# Patient Record
Sex: Male | Born: 1970 | Race: White | Hispanic: No | Marital: Married | Smoking: Never smoker
Health system: Southern US, Community
[De-identification: ages and names within clinical notes are randomized; demographics above are authoritative.]

## PROBLEM LIST (undated history)

## (undated) DIAGNOSIS — C801 Malignant (primary) neoplasm, unspecified: Secondary | ICD-10-CM

## (undated) DIAGNOSIS — C8190 Hodgkin lymphoma, unspecified, unspecified site: Secondary | ICD-10-CM

## (undated) HISTORY — PX: LYMPH NODE DISSECTION: SHX5087

## (undated) HISTORY — DX: Hodgkin lymphoma, unspecified, unspecified site: C81.90

---

## 2008-11-10 ENCOUNTER — Emergency Department (HOSPITAL_COMMUNITY): Admission: EM | Admit: 2008-11-10 | Discharge: 2008-11-11 | Payer: Self-pay | Admitting: Emergency Medicine

## 2010-04-10 ENCOUNTER — Other Ambulatory Visit (INDEPENDENT_AMBULATORY_CARE_PROVIDER_SITE_OTHER): Payer: Self-pay | Admitting: Otolaryngology

## 2010-04-10 DIAGNOSIS — R221 Localized swelling, mass and lump, neck: Secondary | ICD-10-CM

## 2010-04-17 ENCOUNTER — Other Ambulatory Visit: Payer: Self-pay

## 2010-06-29 ENCOUNTER — Encounter: Payer: Self-pay | Admitting: Oncology

## 2010-07-01 ENCOUNTER — Other Ambulatory Visit: Payer: Self-pay | Admitting: Oncology

## 2010-07-01 ENCOUNTER — Encounter (HOSPITAL_BASED_OUTPATIENT_CLINIC_OR_DEPARTMENT_OTHER): Payer: PRIVATE HEALTH INSURANCE | Admitting: Oncology

## 2010-07-01 DIAGNOSIS — C8190 Hodgkin lymphoma, unspecified, unspecified site: Secondary | ICD-10-CM

## 2010-07-01 DIAGNOSIS — C8111 Nodular sclerosis classical Hodgkin lymphoma, lymph nodes of head, face, and neck: Secondary | ICD-10-CM

## 2010-07-01 LAB — CBC WITH DIFFERENTIAL/PLATELET
Basophils Absolute: 0 10*3/uL (ref 0.0–0.1)
EOS%: 1.3 % (ref 0.0–7.0)
HCT: 43.7 % (ref 38.4–49.9)
MCH: 30 pg (ref 27.2–33.4)
MCHC: 34.3 g/dL (ref 32.0–36.0)
MONO#: 0.4 10*3/uL (ref 0.1–0.9)
NEUT%: 72.2 % (ref 39.0–75.0)
Platelets: 297 10*3/uL (ref 140–400)
RDW: 12.2 % (ref 11.0–14.6)
WBC: 7.9 10*3/uL (ref 4.0–10.3)
lymph#: 1.6 10*3/uL (ref 0.9–3.3)

## 2010-07-01 LAB — COMPREHENSIVE METABOLIC PANEL
AST: 19 U/L (ref 0–37)
Alkaline Phosphatase: 66 U/L (ref 39–117)
BUN: 15 mg/dL (ref 6–23)
Calcium: 10.4 mg/dL (ref 8.4–10.5)
Chloride: 103 mEq/L (ref 96–112)
Glucose, Bld: 93 mg/dL (ref 70–99)
Sodium: 140 mEq/L (ref 135–145)
Total Bilirubin: 0.5 mg/dL (ref 0.3–1.2)
Total Protein: 7.7 g/dL (ref 6.0–8.3)

## 2010-07-01 LAB — URIC ACID: Uric Acid, Serum: 4.8 mg/dL (ref 4.0–7.8)

## 2010-07-02 ENCOUNTER — Other Ambulatory Visit: Payer: Self-pay | Admitting: Oncology

## 2010-07-02 DIAGNOSIS — C8190 Hodgkin lymphoma, unspecified, unspecified site: Secondary | ICD-10-CM

## 2010-07-08 ENCOUNTER — Ambulatory Visit (HOSPITAL_COMMUNITY)
Admission: RE | Admit: 2010-07-08 | Discharge: 2010-07-08 | Disposition: A | Discharge status: Home / Self Care | Payer: PRIVATE HEALTH INSURANCE | Source: Ambulatory Visit | Attending: Oncology | Admitting: Oncology

## 2010-07-08 ENCOUNTER — Encounter (HOSPITAL_COMMUNITY)
Admission: RE | Admit: 2010-07-08 | Discharge: 2010-07-08 | Disposition: A | Discharge status: Home / Self Care | Payer: PRIVATE HEALTH INSURANCE | Source: Ambulatory Visit | Attending: Oncology | Admitting: Oncology

## 2010-07-08 DIAGNOSIS — C8191 Hodgkin lymphoma, unspecified, lymph nodes of head, face, and neck: Secondary | ICD-10-CM | POA: Insufficient documentation

## 2010-07-08 DIAGNOSIS — J3489 Other specified disorders of nose and nasal sinuses: Secondary | ICD-10-CM | POA: Insufficient documentation

## 2010-07-08 DIAGNOSIS — R599 Enlarged lymph nodes, unspecified: Secondary | ICD-10-CM | POA: Insufficient documentation

## 2010-07-08 DIAGNOSIS — C8190 Hodgkin lymphoma, unspecified, unspecified site: Secondary | ICD-10-CM

## 2010-07-08 DIAGNOSIS — Z79899 Other long term (current) drug therapy: Secondary | ICD-10-CM | POA: Insufficient documentation

## 2010-07-08 DIAGNOSIS — M47812 Spondylosis without myelopathy or radiculopathy, cervical region: Secondary | ICD-10-CM | POA: Insufficient documentation

## 2010-07-08 DIAGNOSIS — Z01812 Encounter for preprocedural laboratory examination: Secondary | ICD-10-CM | POA: Insufficient documentation

## 2010-07-08 LAB — GLUCOSE, CAPILLARY: Glucose-Capillary: 99 mg/dL (ref 70–99)

## 2010-07-08 MED ORDER — FLUDEOXYGLUCOSE F - 18 (FDG) INJECTION
18.7000 | Freq: Once | INTRAVENOUS | Status: AC | PRN
  Administered 2010-07-08: 18.7 via INTRAVENOUS

## 2010-07-08 MED ORDER — IOHEXOL 300 MG/ML  SOLN
100.0000 mL | Freq: Once | INTRAMUSCULAR | Status: AC | PRN
  Administered 2010-07-08: 100 mL via INTRAVENOUS

## 2010-07-09 ENCOUNTER — Ambulatory Visit (HOSPITAL_COMMUNITY)
Admission: RE | Admit: 2010-07-09 | Discharge: 2010-07-09 | Disposition: A | Discharge status: Home / Self Care | Payer: PRIVATE HEALTH INSURANCE | Source: Ambulatory Visit | Attending: Oncology | Admitting: Oncology

## 2010-07-09 ENCOUNTER — Ambulatory Visit (HOSPITAL_COMMUNITY): Payer: PRIVATE HEALTH INSURANCE

## 2010-07-09 ENCOUNTER — Other Ambulatory Visit: Payer: Self-pay | Admitting: Interventional Radiology

## 2010-07-09 DIAGNOSIS — C8190 Hodgkin lymphoma, unspecified, unspecified site: Secondary | ICD-10-CM | POA: Insufficient documentation

## 2010-07-09 DIAGNOSIS — Z01812 Encounter for preprocedural laboratory examination: Secondary | ICD-10-CM | POA: Insufficient documentation

## 2010-07-09 DIAGNOSIS — R599 Enlarged lymph nodes, unspecified: Secondary | ICD-10-CM | POA: Insufficient documentation

## 2010-07-09 LAB — CBC
HCT: 40.9 % (ref 39.0–52.0)
MCHC: 34 g/dL (ref 30.0–36.0)
MCV: 84.7 fL (ref 78.0–100.0)
RBC: 4.83 MIL/uL (ref 4.22–5.81)
WBC: 8.3 10*3/uL (ref 4.0–10.5)

## 2010-07-09 LAB — PROTIME-INR: INR: 1.05 (ref 0.00–1.49)

## 2010-07-15 ENCOUNTER — Ambulatory Visit (HOSPITAL_COMMUNITY)
Admission: RE | Admit: 2010-07-15 | Discharge: 2010-07-15 | Disposition: A | Discharge status: Home / Self Care | Payer: PRIVATE HEALTH INSURANCE | Source: Ambulatory Visit | Attending: Oncology | Admitting: Oncology

## 2010-07-15 DIAGNOSIS — C8190 Hodgkin lymphoma, unspecified, unspecified site: Secondary | ICD-10-CM | POA: Insufficient documentation

## 2010-07-15 DIAGNOSIS — C50929 Malignant neoplasm of unspecified site of unspecified male breast: Secondary | ICD-10-CM | POA: Insufficient documentation

## 2010-07-15 DIAGNOSIS — Z09 Encounter for follow-up examination after completed treatment for conditions other than malignant neoplasm: Secondary | ICD-10-CM | POA: Insufficient documentation

## 2010-07-16 ENCOUNTER — Ambulatory Visit (HOSPITAL_COMMUNITY)
Admission: RE | Admit: 2010-07-16 | Discharge: 2010-07-16 | Disposition: A | Discharge status: Home / Self Care | Payer: PRIVATE HEALTH INSURANCE | Source: Ambulatory Visit | Attending: Oncology | Admitting: Oncology

## 2010-07-16 ENCOUNTER — Other Ambulatory Visit: Payer: Self-pay | Admitting: Oncology

## 2010-07-16 ENCOUNTER — Ambulatory Visit (HOSPITAL_COMMUNITY): Admission: RE | Admit: 2010-07-16 | Payer: PRIVATE HEALTH INSURANCE | Source: Ambulatory Visit | Admitting: Oncology

## 2010-07-16 DIAGNOSIS — C8190 Hodgkin lymphoma, unspecified, unspecified site: Secondary | ICD-10-CM | POA: Insufficient documentation

## 2010-07-17 ENCOUNTER — Encounter (HOSPITAL_BASED_OUTPATIENT_CLINIC_OR_DEPARTMENT_OTHER): Payer: PRIVATE HEALTH INSURANCE | Admitting: Oncology

## 2010-07-17 ENCOUNTER — Other Ambulatory Visit: Payer: Self-pay | Admitting: Oncology

## 2010-07-17 DIAGNOSIS — Z5111 Encounter for antineoplastic chemotherapy: Secondary | ICD-10-CM

## 2010-07-17 DIAGNOSIS — C8190 Hodgkin lymphoma, unspecified, unspecified site: Secondary | ICD-10-CM

## 2010-07-17 DIAGNOSIS — C8198 Hodgkin lymphoma, unspecified, lymph nodes of multiple sites: Secondary | ICD-10-CM

## 2010-07-17 LAB — COMPREHENSIVE METABOLIC PANEL
ALT: 18 U/L (ref 0–53)
AST: 18 U/L (ref 0–37)
Alkaline Phosphatase: 70 U/L (ref 39–117)
CO2: 26 mEq/L (ref 19–32)
Creatinine, Ser: 0.88 mg/dL (ref 0.50–1.35)
Sodium: 138 mEq/L (ref 135–145)
Total Bilirubin: 0.4 mg/dL (ref 0.3–1.2)
Total Protein: 7.5 g/dL (ref 6.0–8.3)

## 2010-07-17 LAB — CBC WITH DIFFERENTIAL/PLATELET
BASO%: 0.5 % (ref 0.0–2.0)
EOS%: 2.1 % (ref 0.0–7.0)
LYMPH%: 22.4 % (ref 14.0–49.0)
MCH: 29.2 pg (ref 27.2–33.4)
MCHC: 34.4 g/dL (ref 32.0–36.0)
MONO#: 0.7 10*3/uL (ref 0.1–0.9)
Platelets: 256 10*3/uL (ref 140–400)
RBC: 5.06 10*6/uL (ref 4.20–5.82)
WBC: 7.8 10*3/uL (ref 4.0–10.3)

## 2010-07-18 ENCOUNTER — Encounter (HOSPITAL_BASED_OUTPATIENT_CLINIC_OR_DEPARTMENT_OTHER): Payer: PRIVATE HEALTH INSURANCE | Admitting: Oncology

## 2010-07-18 DIAGNOSIS — Z5189 Encounter for other specified aftercare: Secondary | ICD-10-CM

## 2010-07-18 DIAGNOSIS — C8111 Nodular sclerosis classical Hodgkin lymphoma, lymph nodes of head, face, and neck: Secondary | ICD-10-CM

## 2010-07-23 ENCOUNTER — Encounter: Payer: PRIVATE HEALTH INSURANCE | Admitting: Oncology

## 2010-07-23 ENCOUNTER — Other Ambulatory Visit: Payer: Self-pay | Admitting: Oncology

## 2010-07-23 LAB — BASIC METABOLIC PANEL
BUN: 17 mg/dL (ref 6–23)
CO2: 27 mEq/L (ref 19–32)
Calcium: 9.7 mg/dL (ref 8.4–10.5)
Creatinine, Ser: 0.92 mg/dL (ref 0.50–1.35)
Glucose, Bld: 86 mg/dL (ref 70–99)
Sodium: 136 mEq/L (ref 135–145)

## 2010-07-23 LAB — PHOSPHORUS: Phosphorus: 4.1 mg/dL (ref 2.3–4.6)

## 2010-07-31 ENCOUNTER — Other Ambulatory Visit: Payer: Self-pay | Admitting: Oncology

## 2010-07-31 ENCOUNTER — Encounter (HOSPITAL_BASED_OUTPATIENT_CLINIC_OR_DEPARTMENT_OTHER): Payer: PRIVATE HEALTH INSURANCE | Admitting: Oncology

## 2010-07-31 DIAGNOSIS — Z5111 Encounter for antineoplastic chemotherapy: Secondary | ICD-10-CM

## 2010-07-31 DIAGNOSIS — C8190 Hodgkin lymphoma, unspecified, unspecified site: Secondary | ICD-10-CM

## 2010-07-31 LAB — CBC WITH DIFFERENTIAL/PLATELET
BASO%: 1.1 % (ref 0.0–2.0)
Basophils Absolute: 0.1 10*3/uL (ref 0.0–0.1)
HCT: 41.5 % (ref 38.4–49.9)
HGB: 14.3 g/dL (ref 13.0–17.1)
LYMPH%: 22.9 % (ref 14.0–49.0)
MCH: 29.4 pg (ref 27.2–33.4)
MCHC: 34.5 g/dL (ref 32.0–36.0)
MONO#: 1 10*3/uL — ABNORMAL HIGH (ref 0.1–0.9)
NEUT%: 62.8 % (ref 39.0–75.0)
Platelets: 202 10*3/uL (ref 140–400)
WBC: 9.3 10*3/uL (ref 4.0–10.3)

## 2010-07-31 LAB — COMPREHENSIVE METABOLIC PANEL
AST: 23 U/L (ref 0–37)
Albumin: 4.3 g/dL (ref 3.5–5.2)
Alkaline Phosphatase: 75 U/L (ref 39–117)
BUN: 16 mg/dL (ref 6–23)
Potassium: 4.1 mEq/L (ref 3.5–5.3)
Total Bilirubin: 0.3 mg/dL (ref 0.3–1.2)

## 2010-07-31 LAB — URIC ACID: Uric Acid, Serum: 1.8 mg/dL — ABNORMAL LOW (ref 4.0–7.8)

## 2010-08-01 ENCOUNTER — Encounter: Payer: PRIVATE HEALTH INSURANCE | Admitting: Oncology

## 2010-08-01 DIAGNOSIS — C8111 Nodular sclerosis classical Hodgkin lymphoma, lymph nodes of head, face, and neck: Secondary | ICD-10-CM

## 2010-08-01 DIAGNOSIS — IMO0002 Reserved for concepts with insufficient information to code with codable children: Secondary | ICD-10-CM

## 2010-08-06 ENCOUNTER — Other Ambulatory Visit: Payer: Self-pay | Admitting: Oncology

## 2010-08-06 ENCOUNTER — Encounter (HOSPITAL_BASED_OUTPATIENT_CLINIC_OR_DEPARTMENT_OTHER): Payer: PRIVATE HEALTH INSURANCE | Admitting: Oncology

## 2010-08-06 DIAGNOSIS — C8190 Hodgkin lymphoma, unspecified, unspecified site: Secondary | ICD-10-CM

## 2010-08-06 LAB — BASIC METABOLIC PANEL
BUN: 17 mg/dL (ref 6–23)
CO2: 25 mEq/L (ref 19–32)
Chloride: 102 mEq/L (ref 96–112)
Creatinine, Ser: 0.9 mg/dL (ref 0.50–1.35)
Glucose, Bld: 89 mg/dL (ref 70–99)
Potassium: 4.4 mEq/L (ref 3.5–5.3)

## 2010-08-06 LAB — LACTATE DEHYDROGENASE: LDH: 125 U/L (ref 94–250)

## 2010-08-06 LAB — PHOSPHORUS: Phosphorus: 4.1 mg/dL (ref 2.3–4.6)

## 2010-08-10 ENCOUNTER — Ambulatory Visit
Admission: RE | Admit: 2010-08-10 | Discharge: 2010-08-10 | Disposition: A | Discharge status: Home / Self Care | Payer: PRIVATE HEALTH INSURANCE | Source: Ambulatory Visit | Attending: Radiation Oncology | Admitting: Radiation Oncology

## 2010-08-10 DIAGNOSIS — C8118 Nodular sclerosis classical Hodgkin lymphoma, lymph nodes of multiple sites: Secondary | ICD-10-CM | POA: Insufficient documentation

## 2010-08-14 ENCOUNTER — Other Ambulatory Visit: Payer: Self-pay | Admitting: Oncology

## 2010-08-14 ENCOUNTER — Encounter (HOSPITAL_BASED_OUTPATIENT_CLINIC_OR_DEPARTMENT_OTHER): Payer: PRIVATE HEALTH INSURANCE | Admitting: Oncology

## 2010-08-14 DIAGNOSIS — C8190 Hodgkin lymphoma, unspecified, unspecified site: Secondary | ICD-10-CM

## 2010-08-14 LAB — COMPREHENSIVE METABOLIC PANEL
AST: 22 U/L (ref 0–37)
Albumin: 4.4 g/dL (ref 3.5–5.2)
Alkaline Phosphatase: 97 U/L (ref 39–117)
Chloride: 104 mEq/L (ref 96–112)
Glucose, Bld: 84 mg/dL (ref 70–99)
Potassium: 3.9 mEq/L (ref 3.5–5.3)
Sodium: 140 mEq/L (ref 135–145)
Total Protein: 7.2 g/dL (ref 6.0–8.3)

## 2010-08-14 LAB — CBC WITH DIFFERENTIAL/PLATELET
BASO%: 1.3 % (ref 0.0–2.0)
Basophils Absolute: 0.3 10*3/uL — ABNORMAL HIGH (ref 0.0–0.1)
EOS%: 1.4 % (ref 0.0–7.0)
HGB: 14.4 g/dL (ref 13.0–17.1)
MCH: 29.9 pg (ref 27.2–33.4)
MONO#: 2.5 10*3/uL — ABNORMAL HIGH (ref 0.1–0.9)
RDW: 14.3 % (ref 11.0–14.6)
WBC: 23.9 10*3/uL — ABNORMAL HIGH (ref 4.0–10.3)
lymph#: 2.3 10*3/uL (ref 0.9–3.3)

## 2010-08-15 ENCOUNTER — Encounter (HOSPITAL_BASED_OUTPATIENT_CLINIC_OR_DEPARTMENT_OTHER): Payer: PRIVATE HEALTH INSURANCE | Admitting: Oncology

## 2010-08-15 DIAGNOSIS — Z5189 Encounter for other specified aftercare: Secondary | ICD-10-CM

## 2010-08-15 DIAGNOSIS — C8111 Nodular sclerosis classical Hodgkin lymphoma, lymph nodes of head, face, and neck: Secondary | ICD-10-CM

## 2010-08-27 ENCOUNTER — Encounter (HOSPITAL_BASED_OUTPATIENT_CLINIC_OR_DEPARTMENT_OTHER): Payer: PRIVATE HEALTH INSURANCE | Admitting: Oncology

## 2010-08-27 ENCOUNTER — Other Ambulatory Visit: Payer: Self-pay | Admitting: Oncology

## 2010-08-27 DIAGNOSIS — C8190 Hodgkin lymphoma, unspecified, unspecified site: Secondary | ICD-10-CM

## 2010-08-27 DIAGNOSIS — C8111 Nodular sclerosis classical Hodgkin lymphoma, lymph nodes of head, face, and neck: Secondary | ICD-10-CM

## 2010-08-27 DIAGNOSIS — Z5111 Encounter for antineoplastic chemotherapy: Secondary | ICD-10-CM

## 2010-08-27 DIAGNOSIS — C8198 Hodgkin lymphoma, unspecified, lymph nodes of multiple sites: Secondary | ICD-10-CM

## 2010-08-27 LAB — COMPREHENSIVE METABOLIC PANEL
ALT: 34 U/L (ref 0–53)
Albumin: 4.3 g/dL (ref 3.5–5.2)
BUN: 13 mg/dL (ref 6–23)
CO2: 29 mEq/L (ref 19–32)
Calcium: 9.5 mg/dL (ref 8.4–10.5)
Chloride: 105 mEq/L (ref 96–112)
Creatinine, Ser: 0.97 mg/dL (ref 0.50–1.35)

## 2010-08-27 LAB — CBC WITH DIFFERENTIAL/PLATELET
Basophils Absolute: 0.2 10*3/uL — ABNORMAL HIGH (ref 0.0–0.1)
HCT: 40 % (ref 38.4–49.9)
HGB: 13.7 g/dL (ref 13.0–17.1)
MONO#: 1.2 10*3/uL — ABNORMAL HIGH (ref 0.1–0.9)
NEUT#: 11.7 10*3/uL — ABNORMAL HIGH (ref 1.5–6.5)
NEUT%: 76 % — ABNORMAL HIGH (ref 39.0–75.0)
WBC: 15.4 10*3/uL — ABNORMAL HIGH (ref 4.0–10.3)
lymph#: 1.9 10*3/uL (ref 0.9–3.3)

## 2010-08-28 ENCOUNTER — Encounter (HOSPITAL_BASED_OUTPATIENT_CLINIC_OR_DEPARTMENT_OTHER): Payer: PRIVATE HEALTH INSURANCE | Admitting: Oncology

## 2010-08-28 DIAGNOSIS — Z5111 Encounter for antineoplastic chemotherapy: Secondary | ICD-10-CM

## 2010-08-28 DIAGNOSIS — C8119 Nodular sclerosis classical Hodgkin lymphoma, extranodal and solid organ sites: Secondary | ICD-10-CM

## 2010-08-29 ENCOUNTER — Encounter: Payer: PRIVATE HEALTH INSURANCE | Admitting: Oncology

## 2010-09-04 ENCOUNTER — Other Ambulatory Visit (HOSPITAL_COMMUNITY): Payer: PRIVATE HEALTH INSURANCE

## 2010-09-10 ENCOUNTER — Inpatient Hospital Stay (HOSPITAL_COMMUNITY): Admission: RE | Admit: 2010-09-10 | Payer: PRIVATE HEALTH INSURANCE | Source: Ambulatory Visit

## 2010-09-10 ENCOUNTER — Other Ambulatory Visit (HOSPITAL_COMMUNITY): Payer: PRIVATE HEALTH INSURANCE

## 2010-09-11 ENCOUNTER — Other Ambulatory Visit: Payer: Self-pay | Admitting: Oncology

## 2010-09-11 ENCOUNTER — Encounter (HOSPITAL_BASED_OUTPATIENT_CLINIC_OR_DEPARTMENT_OTHER): Payer: PRIVATE HEALTH INSURANCE | Admitting: Oncology

## 2010-09-11 DIAGNOSIS — C8119 Nodular sclerosis classical Hodgkin lymphoma, extranodal and solid organ sites: Secondary | ICD-10-CM

## 2010-09-11 DIAGNOSIS — C8111 Nodular sclerosis classical Hodgkin lymphoma, lymph nodes of head, face, and neck: Secondary | ICD-10-CM

## 2010-09-11 DIAGNOSIS — C8190 Hodgkin lymphoma, unspecified, unspecified site: Secondary | ICD-10-CM

## 2010-09-11 DIAGNOSIS — Z5111 Encounter for antineoplastic chemotherapy: Secondary | ICD-10-CM

## 2010-09-11 DIAGNOSIS — C8198 Hodgkin lymphoma, unspecified, lymph nodes of multiple sites: Secondary | ICD-10-CM

## 2010-09-11 LAB — COMPREHENSIVE METABOLIC PANEL WITH GFR
ALT: 63 U/L — ABNORMAL HIGH (ref 0–53)
AST: 32 U/L (ref 0–37)
Albumin: 4.6 g/dL (ref 3.5–5.2)
Alkaline Phosphatase: 93 U/L (ref 39–117)
BUN: 13 mg/dL (ref 6–23)
CO2: 25 meq/L (ref 19–32)
Calcium: 9.8 mg/dL (ref 8.4–10.5)
Chloride: 101 meq/L (ref 96–112)
Creatinine, Ser: 0.8 mg/dL (ref 0.50–1.35)
Glucose, Bld: 80 mg/dL (ref 70–99)
Potassium: 4.1 meq/L (ref 3.5–5.3)
Sodium: 137 meq/L (ref 135–145)
Total Bilirubin: 0.3 mg/dL (ref 0.3–1.2)
Total Protein: 7.2 g/dL (ref 6.0–8.3)

## 2010-09-11 LAB — CBC WITH DIFFERENTIAL/PLATELET
BASO%: 2.2 % — ABNORMAL HIGH (ref 0.0–2.0)
HCT: 40.4 % (ref 38.4–49.9)
MCHC: 33.9 g/dL (ref 32.0–36.0)
MONO#: 2.1 10*3/uL — ABNORMAL HIGH (ref 0.1–0.9)
NEUT%: 72.8 % (ref 39.0–75.0)
RDW: 16.4 % — ABNORMAL HIGH (ref 11.0–14.6)
WBC: 20.8 10*3/uL — ABNORMAL HIGH (ref 4.0–10.3)
lymph#: 2.4 10*3/uL (ref 0.9–3.3)
nRBC: 0 % (ref 0–0)

## 2010-09-14 DIAGNOSIS — Z5189 Encounter for other specified aftercare: Secondary | ICD-10-CM

## 2010-09-16 ENCOUNTER — Other Ambulatory Visit (HOSPITAL_COMMUNITY): Payer: PRIVATE HEALTH INSURANCE

## 2010-09-22 ENCOUNTER — Encounter (HOSPITAL_COMMUNITY)
Admission: RE | Admit: 2010-09-22 | Discharge: 2010-09-22 | Disposition: A | Discharge status: Home / Self Care | Payer: PRIVATE HEALTH INSURANCE | Source: Ambulatory Visit | Attending: Oncology | Admitting: Oncology

## 2010-09-22 DIAGNOSIS — C8190 Hodgkin lymphoma, unspecified, unspecified site: Secondary | ICD-10-CM | POA: Insufficient documentation

## 2010-09-22 MED ORDER — FLUDEOXYGLUCOSE F - 18 (FDG) INJECTION
17.7000 | Freq: Once | INTRAVENOUS | Status: AC | PRN
  Administered 2010-09-22: 17.7 via INTRAVENOUS

## 2010-09-24 ENCOUNTER — Other Ambulatory Visit: Payer: Self-pay | Admitting: Oncology

## 2010-09-24 ENCOUNTER — Encounter (HOSPITAL_BASED_OUTPATIENT_CLINIC_OR_DEPARTMENT_OTHER): Payer: PRIVATE HEALTH INSURANCE | Admitting: Oncology

## 2010-09-24 DIAGNOSIS — C8111 Nodular sclerosis classical Hodgkin lymphoma, lymph nodes of head, face, and neck: Secondary | ICD-10-CM

## 2010-09-24 DIAGNOSIS — Z5111 Encounter for antineoplastic chemotherapy: Secondary | ICD-10-CM

## 2010-09-24 DIAGNOSIS — C8198 Hodgkin lymphoma, unspecified, lymph nodes of multiple sites: Secondary | ICD-10-CM

## 2010-09-24 DIAGNOSIS — C8190 Hodgkin lymphoma, unspecified, unspecified site: Secondary | ICD-10-CM

## 2010-09-24 LAB — COMPREHENSIVE METABOLIC PANEL
Albumin: 4.3 g/dL (ref 3.5–5.2)
CO2: 28 mEq/L (ref 19–32)
Glucose, Bld: 81 mg/dL (ref 70–99)
Sodium: 139 mEq/L (ref 135–145)
Total Bilirubin: 0.3 mg/dL (ref 0.3–1.2)
Total Protein: 7 g/dL (ref 6.0–8.3)

## 2010-09-24 LAB — CBC WITH DIFFERENTIAL/PLATELET
Eosinophils Absolute: 0.5 10*3/uL (ref 0.0–0.5)
HCT: 38.1 % — ABNORMAL LOW (ref 38.4–49.9)
HGB: 12.8 g/dL — ABNORMAL LOW (ref 13.0–17.1)
LYMPH%: 5.5 % — ABNORMAL LOW (ref 14.0–49.0)
MONO#: 1.9 10*3/uL — ABNORMAL HIGH (ref 0.1–0.9)
NEUT#: 23.3 10*3/uL — ABNORMAL HIGH (ref 1.5–6.5)
Platelets: 298 10*3/uL (ref 140–400)
RBC: 4.18 10*6/uL — ABNORMAL LOW (ref 4.20–5.82)
WBC: 27.2 10*3/uL — ABNORMAL HIGH (ref 4.0–10.3)

## 2010-09-24 LAB — LACTATE DEHYDROGENASE: LDH: 275 U/L — ABNORMAL HIGH (ref 94–250)

## 2010-09-25 ENCOUNTER — Encounter (HOSPITAL_BASED_OUTPATIENT_CLINIC_OR_DEPARTMENT_OTHER): Payer: PRIVATE HEALTH INSURANCE | Admitting: Oncology

## 2010-09-25 DIAGNOSIS — C8119 Nodular sclerosis classical Hodgkin lymphoma, extranodal and solid organ sites: Secondary | ICD-10-CM

## 2010-09-25 DIAGNOSIS — Z5111 Encounter for antineoplastic chemotherapy: Secondary | ICD-10-CM

## 2010-09-26 ENCOUNTER — Encounter (HOSPITAL_BASED_OUTPATIENT_CLINIC_OR_DEPARTMENT_OTHER): Payer: PRIVATE HEALTH INSURANCE | Admitting: Oncology

## 2010-09-26 DIAGNOSIS — Z5189 Encounter for other specified aftercare: Secondary | ICD-10-CM

## 2010-09-26 DIAGNOSIS — C8111 Nodular sclerosis classical Hodgkin lymphoma, lymph nodes of head, face, and neck: Secondary | ICD-10-CM

## 2010-10-09 ENCOUNTER — Encounter (HOSPITAL_BASED_OUTPATIENT_CLINIC_OR_DEPARTMENT_OTHER): Payer: PRIVATE HEALTH INSURANCE | Admitting: Oncology

## 2010-10-09 ENCOUNTER — Other Ambulatory Visit: Payer: Self-pay | Admitting: Oncology

## 2010-10-09 DIAGNOSIS — C8111 Nodular sclerosis classical Hodgkin lymphoma, lymph nodes of head, face, and neck: Secondary | ICD-10-CM

## 2010-10-09 DIAGNOSIS — C8198 Hodgkin lymphoma, unspecified, lymph nodes of multiple sites: Secondary | ICD-10-CM

## 2010-10-09 DIAGNOSIS — C8190 Hodgkin lymphoma, unspecified, unspecified site: Secondary | ICD-10-CM

## 2010-10-09 DIAGNOSIS — C8119 Nodular sclerosis classical Hodgkin lymphoma, extranodal and solid organ sites: Secondary | ICD-10-CM

## 2010-10-09 DIAGNOSIS — Z5111 Encounter for antineoplastic chemotherapy: Secondary | ICD-10-CM

## 2010-10-09 LAB — COMPREHENSIVE METABOLIC PANEL
ALT: 18 U/L (ref 0–53)
CO2: 25 mEq/L (ref 19–32)
Calcium: 9.2 mg/dL (ref 8.4–10.5)
Chloride: 102 mEq/L (ref 96–112)
Sodium: 139 mEq/L (ref 135–145)
Total Protein: 6.6 g/dL (ref 6.0–8.3)

## 2010-10-09 LAB — CBC WITH DIFFERENTIAL/PLATELET
BASO%: 0.9 % (ref 0.0–2.0)
MCHC: 33.7 g/dL (ref 32.0–36.0)
MONO#: 1.6 10*3/uL — ABNORMAL HIGH (ref 0.1–0.9)
RBC: 4.06 10*6/uL — ABNORMAL LOW (ref 4.20–5.82)
RDW: 16.8 % — ABNORMAL HIGH (ref 11.0–14.6)
WBC: 16.2 10*3/uL — ABNORMAL HIGH (ref 4.0–10.3)
lymph#: 1.9 10*3/uL (ref 0.9–3.3)

## 2010-10-09 LAB — LACTATE DEHYDROGENASE: LDH: 168 U/L (ref 94–250)

## 2010-10-10 ENCOUNTER — Encounter: Payer: PRIVATE HEALTH INSURANCE | Admitting: Oncology

## 2010-10-10 DIAGNOSIS — Z5189 Encounter for other specified aftercare: Secondary | ICD-10-CM

## 2010-10-19 ENCOUNTER — Ambulatory Visit (HOSPITAL_COMMUNITY)
Admission: RE | Admit: 2010-10-19 | Discharge: 2010-10-19 | Disposition: A | Discharge status: Home / Self Care | Payer: PRIVATE HEALTH INSURANCE | Source: Ambulatory Visit | Attending: Oncology | Admitting: Oncology

## 2010-10-19 DIAGNOSIS — C8190 Hodgkin lymphoma, unspecified, unspecified site: Secondary | ICD-10-CM | POA: Insufficient documentation

## 2010-10-23 ENCOUNTER — Encounter (HOSPITAL_BASED_OUTPATIENT_CLINIC_OR_DEPARTMENT_OTHER): Payer: PRIVATE HEALTH INSURANCE | Admitting: Oncology

## 2010-10-23 ENCOUNTER — Other Ambulatory Visit: Payer: Self-pay | Admitting: Oncology

## 2010-10-23 DIAGNOSIS — C8190 Hodgkin lymphoma, unspecified, unspecified site: Secondary | ICD-10-CM

## 2010-10-23 DIAGNOSIS — Z5111 Encounter for antineoplastic chemotherapy: Secondary | ICD-10-CM

## 2010-10-23 DIAGNOSIS — C8111 Nodular sclerosis classical Hodgkin lymphoma, lymph nodes of head, face, and neck: Secondary | ICD-10-CM

## 2010-10-23 DIAGNOSIS — C8119 Nodular sclerosis classical Hodgkin lymphoma, extranodal and solid organ sites: Secondary | ICD-10-CM

## 2010-10-23 LAB — CBC WITH DIFFERENTIAL/PLATELET
BASO%: 1.9 % (ref 0.0–2.0)
EOS%: 4.4 % (ref 0.0–7.0)
HCT: 37.8 % — ABNORMAL LOW (ref 38.4–49.9)
MCH: 29.8 pg (ref 27.2–33.4)
MCHC: 33.3 g/dL (ref 32.0–36.0)
MCV: 89.4 fL (ref 79.3–98.0)
MONO%: 9.9 % (ref 0.0–14.0)
NEUT%: 72.4 % (ref 39.0–75.0)
RDW: 16 % — ABNORMAL HIGH (ref 11.0–14.6)
lymph#: 2.3 10*3/uL (ref 0.9–3.3)

## 2010-10-23 LAB — COMPREHENSIVE METABOLIC PANEL
AST: 16 U/L (ref 0–37)
Albumin: 4.2 g/dL (ref 3.5–5.2)
Alkaline Phosphatase: 82 U/L (ref 39–117)
BUN: 14 mg/dL (ref 6–23)
Potassium: 4.2 mEq/L (ref 3.5–5.3)
Total Bilirubin: 0.3 mg/dL (ref 0.3–1.2)

## 2010-10-24 ENCOUNTER — Encounter: Payer: PRIVATE HEALTH INSURANCE | Admitting: Oncology

## 2010-10-24 DIAGNOSIS — Z5189 Encounter for other specified aftercare: Secondary | ICD-10-CM

## 2010-10-24 DIAGNOSIS — C8190 Hodgkin lymphoma, unspecified, unspecified site: Secondary | ICD-10-CM

## 2010-10-29 ENCOUNTER — Encounter: Payer: Self-pay | Admitting: Oncology

## 2010-10-29 DIAGNOSIS — C8190 Hodgkin lymphoma, unspecified, unspecified site: Secondary | ICD-10-CM | POA: Insufficient documentation

## 2010-11-12 ENCOUNTER — Encounter (HOSPITAL_COMMUNITY)
Admission: RE | Admit: 2010-11-12 | Discharge: 2010-11-12 | Disposition: A | Discharge status: Home / Self Care | Payer: PRIVATE HEALTH INSURANCE | Source: Ambulatory Visit | Attending: Oncology | Admitting: Oncology

## 2010-11-12 ENCOUNTER — Ambulatory Visit (HOSPITAL_BASED_OUTPATIENT_CLINIC_OR_DEPARTMENT_OTHER): Payer: PRIVATE HEALTH INSURANCE | Admitting: Oncology

## 2010-11-12 ENCOUNTER — Other Ambulatory Visit: Payer: Self-pay | Admitting: Oncology

## 2010-11-12 ENCOUNTER — Ambulatory Visit (HOSPITAL_COMMUNITY)
Admission: RE | Admit: 2010-11-12 | Discharge: 2010-11-12 | Disposition: A | Discharge status: Home / Self Care | Payer: PRIVATE HEALTH INSURANCE | Source: Ambulatory Visit | Attending: Oncology | Admitting: Oncology

## 2010-11-12 ENCOUNTER — Telehealth: Payer: Self-pay | Admitting: Oncology

## 2010-11-12 DIAGNOSIS — C8190 Hodgkin lymphoma, unspecified, unspecified site: Secondary | ICD-10-CM

## 2010-11-12 DIAGNOSIS — C8119 Nodular sclerosis classical Hodgkin lymphoma, extranodal and solid organ sites: Secondary | ICD-10-CM

## 2010-11-12 DIAGNOSIS — Z79899 Other long term (current) drug therapy: Secondary | ICD-10-CM

## 2010-11-12 DIAGNOSIS — C8111 Nodular sclerosis classical Hodgkin lymphoma, lymph nodes of head, face, and neck: Secondary | ICD-10-CM

## 2010-11-12 DIAGNOSIS — Z5111 Encounter for antineoplastic chemotherapy: Secondary | ICD-10-CM

## 2010-11-12 LAB — CBC WITH DIFFERENTIAL/PLATELET
Basophils Absolute: 0 10*3/uL (ref 0.0–0.1)
EOS%: 4.1 % (ref 0.0–7.0)
Eosinophils Absolute: 0.4 10*3/uL (ref 0.0–0.5)
HCT: 37.8 % — ABNORMAL LOW (ref 38.4–49.9)
HGB: 12.7 g/dL — ABNORMAL LOW (ref 13.0–17.1)
LYMPH%: 15 % (ref 14.0–49.0)
MCH: 30.9 pg (ref 27.2–33.4)
MCV: 92.2 fL (ref 79.3–98.0)
MONO%: 9.8 % (ref 0.0–14.0)
NEUT%: 70.7 % (ref 39.0–75.0)
Platelets: 304 10*3/uL (ref 140–400)
RDW: 16.5 % — ABNORMAL HIGH (ref 11.0–14.6)

## 2010-11-12 LAB — COMPREHENSIVE METABOLIC PANEL
Alkaline Phosphatase: 63 U/L (ref 39–117)
BUN: 14 mg/dL (ref 6–23)
CO2: 26 mEq/L (ref 19–32)
Glucose, Bld: 74 mg/dL (ref 70–99)
Total Bilirubin: 0.4 mg/dL (ref 0.3–1.2)

## 2010-11-12 MED ORDER — FLUDEOXYGLUCOSE F - 18 (FDG) INJECTION
17.4000 | Freq: Once | INTRAVENOUS | Status: AC | PRN
  Administered 2010-11-12: 17.4 via INTRAVENOUS

## 2010-11-12 NOTE — Telephone Encounter (Signed)
gv pt appt schedule for dec thru feb  °

## 2010-11-12 NOTE — Telephone Encounter (Signed)
gv pt appt schedule for dec thru feb

## 2010-12-25 ENCOUNTER — Ambulatory Visit (HOSPITAL_BASED_OUTPATIENT_CLINIC_OR_DEPARTMENT_OTHER): Payer: PRIVATE HEALTH INSURANCE

## 2010-12-25 DIAGNOSIS — Z452 Encounter for adjustment and management of vascular access device: Secondary | ICD-10-CM

## 2010-12-25 DIAGNOSIS — C8111 Nodular sclerosis classical Hodgkin lymphoma, lymph nodes of head, face, and neck: Secondary | ICD-10-CM

## 2010-12-25 MED ORDER — HEPARIN SOD (PORK) LOCK FLUSH 100 UNIT/ML IV SOLN
500.0000 [IU] | Freq: Once | INTRAVENOUS | Status: AC
  Administered 2010-12-25: 500 [IU] via INTRAVENOUS
  Filled 2010-12-25: qty 5

## 2010-12-25 MED ORDER — SODIUM CHLORIDE 0.9 % IJ SOLN
10.0000 mL | INTRAMUSCULAR | Status: DC | PRN
  Administered 2010-12-25: 10 mL via INTRAVENOUS
  Filled 2010-12-25: qty 10

## 2011-01-05 ENCOUNTER — Telehealth: Payer: Self-pay | Admitting: Oncology

## 2011-01-05 NOTE — Telephone Encounter (Signed)
Faxed clinical information to Hester Mates, [employer] case manager, @ [phone].

## 2011-02-02 ENCOUNTER — Encounter: Payer: Self-pay | Admitting: *Deleted

## 2011-02-02 ENCOUNTER — Other Ambulatory Visit: Payer: Self-pay | Admitting: Dermatology

## 2011-02-03 ENCOUNTER — Ambulatory Visit (HOSPITAL_BASED_OUTPATIENT_CLINIC_OR_DEPARTMENT_OTHER): Payer: PRIVATE HEALTH INSURANCE | Admitting: Oncology

## 2011-02-03 ENCOUNTER — Telehealth: Payer: Self-pay | Admitting: Oncology

## 2011-02-03 ENCOUNTER — Other Ambulatory Visit: Payer: PRIVATE HEALTH INSURANCE | Admitting: Lab

## 2011-02-03 VITALS — BP 130/67 | HR 67 | Temp 99.1°F | Ht 72.0 in | Wt 164.5 lb

## 2011-02-03 DIAGNOSIS — C8190 Hodgkin lymphoma, unspecified, unspecified site: Secondary | ICD-10-CM

## 2011-02-03 LAB — CBC WITH DIFFERENTIAL/PLATELET
Basophils Absolute: 0.1 10*3/uL (ref 0.0–0.1)
Eosinophils Absolute: 0.2 10*3/uL (ref 0.0–0.5)
HGB: 14.7 g/dL (ref 13.0–17.1)
LYMPH%: 18.2 % (ref 14.0–49.0)
MCV: 84.4 fL (ref 79.3–98.0)
MONO#: 0.5 10*3/uL (ref 0.1–0.9)
MONO%: 7.7 % (ref 0.0–14.0)
NEUT#: 4.2 10*3/uL (ref 1.5–6.5)
Platelets: 218 10*3/uL (ref 140–400)
RBC: 5.13 10*6/uL (ref 4.20–5.82)
RDW: 12.3 % (ref 11.0–14.6)
WBC: 6.1 10*3/uL (ref 4.0–10.3)

## 2011-02-03 LAB — COMPREHENSIVE METABOLIC PANEL
Albumin: 4.6 g/dL (ref 3.5–5.2)
Alkaline Phosphatase: 58 U/L (ref 39–117)
BUN: 21 mg/dL (ref 6–23)
CO2: 28 mEq/L (ref 19–32)
Glucose, Bld: 76 mg/dL (ref 70–99)
Potassium: 4.4 mEq/L (ref 3.5–5.3)
Sodium: 138 mEq/L (ref 135–145)
Total Protein: 6.9 g/dL (ref 6.0–8.3)

## 2011-02-03 LAB — LACTATE DEHYDROGENASE: LDH: 144 U/L (ref 94–250)

## 2011-02-03 MED ORDER — HEPARIN SOD (PORK) LOCK FLUSH 100 UNIT/ML IV SOLN
500.0000 [IU] | Freq: Once | INTRAVENOUS | Status: AC
  Administered 2011-02-03: 500 [IU] via INTRAVENOUS
  Filled 2011-02-03: qty 5

## 2011-02-03 MED ORDER — SODIUM CHLORIDE 0.9 % IJ SOLN
10.0000 mL | INTRAMUSCULAR | Status: DC | PRN
  Administered 2011-02-03: 10 mL via INTRAVENOUS
  Filled 2011-02-03: qty 10

## 2011-02-03 NOTE — Progress Notes (Signed)
[facility] OFFICE PROGRESS NOTE  Cc:  JOBE,DANIEL B., MD, MD  DIAGNOSIS:  History of stage II Hodgkin lymphoma, nodular sclerosing syncytial variant; IPS score of 1 (given his gender);  low-risk features with normal CBC, LDH, and ESR.  PAST THERAPY:  ABVD d1 and 15 every 28 days x 4 cycles between July 17, 2010, and October 23, 2010.  He is s/p consolidative radiation therapy with [facility] finished in November 2012.   CURRENT THERAPY: watchful observation.      INTERVAL HISTORY: Gregory Mccarthy 41 y.o. male returns for regular follow up by himself.  He reports feeling well. He is working full-time without fatigue. He is also able to work out running jogging without chest pain, dyspnea exertion, palpitation, shortness of breath. He denies any palpable lymph node swelling, night sweats, anorexia, weight loss, neuropathy, bleeding symptom, headache, skin rash, pedal edema, low back pain, bowel bladder incontinence.  He complains of dry scalp and dandruff which have improved with shampooing his hair only once every 3 days and moisturizer.    MEDICAL HISTORY: Past Medical History  Diagnosis Date  . Hodgkin lymphoma     SURGICAL HISTORY: No past surgical history on file.  MEDICATIONS: Current Outpatient Prescriptions  Medication Sig Dispense Refill  . Multiple Vitamin (MULTIVITAMIN) tablet Take 1 tablet by mouth daily.       Current Facility-Administered Medications  Medication Dose Route Frequency Provider Last Rate Last Dose  . heparin lock flush 100 unit/mL  500 Units Intravenous Once Jethro Bolus, MD   500 Units at 02/03/11 1105  . sodium chloride 0.9 % injection 10 mL  10 mL Intravenous PRN Jethro Bolus, MD   10 mL at 02/03/11 1105    ALLERGIES:   has no known allergies.  REVIEW OF SYSTEMS:  The rest of the 14-point review of system was negative.   Filed Vitals:   02/03/11 1039  BP: 130/67  Pulse: 67  Temp: 99.1 F (37.3 C)   Wt Readings from Last 3 Encounters:  02/03/11  164 lb 8 oz (74.617 kg)  11/12/10 159 lb (72.122 kg)   ECOG Performance status: 0  PHYSICAL EXAMINATION:   General:  well-nourished in no acute distress.  Eyes:  no scleral icterus.  ENT:  There were no oropharyngeal lesions.  Neck was without thyromegaly.  Lymphatics:  Negative cervical, supraclavicular or axillary adenopathy.  Respiratory: lungs were clear bilaterally without wheezing or crackles.  Cardiovascular:  Regular rate and rhythm, S1/S2, without murmur, rub or gallop.  There was no pedal edema.  GI:  abdomen was soft, flat, nontender, nondistended, without organomegaly.  Muscoloskeletal:  no spinal tenderness of palpation of vertebral spine.  Skin exam was without echymosis, petichae.  Neuro exam was nonfocal.  Patient was able to get on and off exam table without assistance.  Gait was normal.  Patient was alerted and oriented.  Attention was good.   Language was appropriate.  Mood was normal without depression.  Speech was not pressured.  Thought content was not tangential.     LABORATORY/RADIOLOGY DATA:  Lab Results  Component Value Date   WBC 6.1 02/03/2011   HGB 14.7 02/03/2011   HCT 43.3 02/03/2011   PLT 218 02/03/2011   GLUCOSE 74 11/12/2010   ALT 32 11/12/2010   AST 27 11/12/2010   NA 143 11/12/2010   K 3.9 11/12/2010   CL 105 11/12/2010   CREATININE 0.88 11/12/2010   BUN 14 11/12/2010   CO2 26 11/12/2010  INR 1.05 07/09/2010    ASSESSMENT AND PLAN:   1. History of Hodgkin lymphoma:  I discussed with [name] that he has no evidence of disease recurrence on clinical history, physical exam.  He has no major side effects from chemo or radiation.  2. Surveillance:  I discussed with [name] that CT chest/abdomen/pel is recommended by [facility] guideline every 6 months.  I do not advocate PET scan for surveillance unless there is equivocal findings on CT.   3. Follow up:  CT in April 2013 and RV with me the day after.   The length of time of the face-to-face encounter  was 10 minutes. More than 50% of time was spent counseling and coordination of care.

## 2011-02-03 NOTE — Telephone Encounter (Signed)
Gv pt appt for feb-aug2013.  scheduled ct scan for 04/19/2011 @ [facility]

## 2011-03-17 ENCOUNTER — Ambulatory Visit (HOSPITAL_BASED_OUTPATIENT_CLINIC_OR_DEPARTMENT_OTHER): Payer: PRIVATE HEALTH INSURANCE

## 2011-03-17 DIAGNOSIS — C8190 Hodgkin lymphoma, unspecified, unspecified site: Secondary | ICD-10-CM

## 2011-03-17 DIAGNOSIS — Z469 Encounter for fitting and adjustment of unspecified device: Secondary | ICD-10-CM

## 2011-03-17 MED ORDER — SODIUM CHLORIDE 0.9 % IJ SOLN
10.0000 mL | INTRAMUSCULAR | Status: DC | PRN
  Administered 2011-03-17: 10 mL via INTRAVENOUS
  Filled 2011-03-17: qty 10

## 2011-03-17 MED ORDER — HEPARIN SOD (PORK) LOCK FLUSH 100 UNIT/ML IV SOLN
500.0000 [IU] | Freq: Once | INTRAVENOUS | Status: AC
  Administered 2011-03-17: 500 [IU] via INTRAVENOUS
  Filled 2011-03-17: qty 5

## 2011-04-19 ENCOUNTER — Ambulatory Visit (HOSPITAL_COMMUNITY)
Admission: RE | Admit: 2011-04-19 | Discharge: 2011-04-19 | Disposition: A | Discharge status: Home / Self Care | Payer: PRIVATE HEALTH INSURANCE | Source: Ambulatory Visit | Attending: Oncology | Admitting: Oncology

## 2011-04-19 ENCOUNTER — Encounter (HOSPITAL_COMMUNITY): Payer: Self-pay

## 2011-04-19 DIAGNOSIS — R935 Abnormal findings on diagnostic imaging of other abdominal regions, including retroperitoneum: Secondary | ICD-10-CM | POA: Insufficient documentation

## 2011-04-19 DIAGNOSIS — C8190 Hodgkin lymphoma, unspecified, unspecified site: Secondary | ICD-10-CM | POA: Insufficient documentation

## 2011-04-19 HISTORY — DX: Malignant (primary) neoplasm, unspecified: C80.1

## 2011-04-19 MED ORDER — IOHEXOL 300 MG/ML  SOLN
100.0000 mL | Freq: Once | INTRAMUSCULAR | Status: AC | PRN
  Administered 2011-04-19: 100 mL via INTRAVENOUS

## 2011-04-21 ENCOUNTER — Ambulatory Visit (HOSPITAL_BASED_OUTPATIENT_CLINIC_OR_DEPARTMENT_OTHER): Payer: PRIVATE HEALTH INSURANCE | Admitting: Oncology

## 2011-04-21 ENCOUNTER — Other Ambulatory Visit (HOSPITAL_BASED_OUTPATIENT_CLINIC_OR_DEPARTMENT_OTHER): Payer: PRIVATE HEALTH INSURANCE | Admitting: Lab

## 2011-04-21 ENCOUNTER — Telehealth: Payer: Self-pay | Admitting: Oncology

## 2011-04-21 VITALS — BP 122/76 | HR 75 | Temp 97.7°F | Ht 72.0 in | Wt 164.7 lb

## 2011-04-21 DIAGNOSIS — R209 Unspecified disturbances of skin sensation: Secondary | ICD-10-CM

## 2011-04-21 DIAGNOSIS — Z8571 Personal history of Hodgkin lymphoma: Secondary | ICD-10-CM

## 2011-04-21 DIAGNOSIS — C8190 Hodgkin lymphoma, unspecified, unspecified site: Secondary | ICD-10-CM

## 2011-04-21 DIAGNOSIS — R948 Abnormal results of function studies of other organs and systems: Secondary | ICD-10-CM

## 2011-04-21 LAB — CBC WITH DIFFERENTIAL/PLATELET
BASO%: 0.8 % (ref 0.0–2.0)
LYMPH%: 12.5 % — ABNORMAL LOW (ref 14.0–49.0)
MCHC: 34.7 g/dL (ref 32.0–36.0)
MONO#: 0.7 10*3/uL (ref 0.1–0.9)
Platelets: 224 10*3/uL (ref 140–400)
RBC: 4.96 10*6/uL (ref 4.20–5.82)
WBC: 9.1 10*3/uL (ref 4.0–10.3)
lymph#: 1.1 10*3/uL (ref 0.9–3.3)
nRBC: 0 % (ref 0–0)

## 2011-04-21 LAB — COMPREHENSIVE METABOLIC PANEL
ALT: 18 U/L (ref 0–53)
BUN: 16 mg/dL (ref 6–23)
CO2: 25 mEq/L (ref 19–32)
Calcium: 10.1 mg/dL (ref 8.4–10.5)
Creatinine, Ser: 1.13 mg/dL (ref 0.50–1.35)
Glucose, Bld: 72 mg/dL (ref 70–99)
Total Bilirubin: 0.5 mg/dL (ref 0.3–1.2)

## 2011-04-21 LAB — LACTATE DEHYDROGENASE: LDH: 122 U/L (ref 94–250)

## 2011-04-21 NOTE — Patient Instructions (Addendum)
1.  History of Hodgkin's lymphoma:  Continue to be in remission.   2.  Surveillance CT this week was negative.  CT CHEST  Findings: No enlarged axillary or supraclavicular lymph nodes.  No enlarged mediastinal or hilar lymph nodes. No pericardial or  pleural effusions identified.  The lungs appear clear.  The visualized osseous structures are unremarkable.  The no aggressive bone lesions noted.  IMPRESSION:  1. Negative for mass or adenopathy.  CT ABDOMEN AND PELVIS  Findings: Normal appearance of the spleen.  The adrenal glands are normal.  Normal appearance of the liver. The gallbladder is normal.  No biliary dilatation. Pancreas is normal.  Both adrenal glands are within normal limits. The right kidney is  normal. The left kidney is also normal. There is no enlarged  upper abdominal lymph nodes. There is no pelvic or inguinal  adenopathy.  The urinary bladder appears normal. The stomach and the small  bowel loops appear within normal limits.  The appendix is identified within the right lower quadrant.  The appendix is slightly increased in caliber measuring 0.9 cm,  image 104. There is mild appendiceal wall thickening.  No free fluid or fluid collections.  The colon appears normal.  Review of the visualized osseous structures is unremarkable.  IMPRESSION:  1. There is no mass or adenopathy within the abdomen or pelvis to  suggest residual or recurrent lymphoma.  2. Increased caliber of the appendix with mild wall thickening.  Correlate for any clinical signs or symptoms of appendicitis.  3.  Follow up:  With Dr. Lodema Pilot NP Belenda Cruise in about 3 months.

## 2011-04-21 NOTE — Telephone Encounter (Signed)
Gv pt appts for april-july2013

## 2011-04-21 NOTE — Progress Notes (Signed)
[facility] OFFICE PROGRESS NOTE  Cc:  Gregory Mccarthy,Gregory B., MD, MD  DIAGNOSIS:  History of stage II Hodgkin lymphoma, nodular sclerosing syncytial variant; IPS score of 1 (given his gender);  low-risk features with normal CBC, LDH, and ESR.  PAST THERAPY:  ABVD d1 and 15 every 28 days x 4 cycles between July 17, 2010, and October 23, 2010.  He is s/p consolidative radiation therapy with [facility] finished in November 2012.   CURRENT THERAPY: watchful observation.   INTERVAL HISTORY: Nayden Czajka 41 y.o. male returns for regular follow up with his wife. He reports feeling well. Occasionally, when he touches his chin to his chest, he has this shooting sensation running down his back without pain or weakness in the bilateral arms.  He denies night sweat, adenopathy, anorexia, weight loss.  He works full time without fatigue.  He denies abdominal pain, fever, diarrhea, constipation, bleeding.   Patient denies fatigue, headache, visual changes, confusion, drenching night sweats, palpable lymph node swelling, mucositis, odynophagia, dysphagia, nausea vomiting, jaundice, chest pain, palpitation, shortness of breath, dyspnea on exertion, productive cough, gum bleeding, epistaxis, hematemesis, hemoptysis, abdominal pain, abdominal swelling, early satiety, melena, hematochezia, hematuria, skin rash, spontaneous bleeding, joint swelling, joint pain, heat or cold intolerance, bowel bladder incontinence, back pain, focal motor weakness, paresthesia, depression, suicidal or homocidal ideation, feeling hopelessness.   MEDICAL HISTORY: Past Medical History  Diagnosis Date  . Hodgkin lymphoma   . Cancer     Hodgkins    SURGICAL HISTORY: No past surgical history on file.  MEDICATIONS: Current Outpatient Prescriptions  Medication Sig Dispense Refill  . Multiple Vitamin (MULTIVITAMIN) tablet Take 1 tablet by mouth daily.        ALLERGIES:   has no known allergies.  REVIEW OF SYSTEMS:  The rest of  the 14-point review of system was negative.   Filed Vitals:   04/21/11 0831  BP: 122/76  Pulse: 75  Temp: 97.7 F (36.5 C)   Wt Readings from Last 3 Encounters:  04/21/11 164 lb 11.2 oz (74.707 kg)  02/03/11 164 lb 8 oz (74.617 kg)  11/12/10 159 lb (72.122 kg)   ECOG Performance status: 0  PHYSICAL EXAMINATION:   General:  well-nourished in no acute distress.  Eyes:  no scleral icterus.  ENT:  There were no oropharyngeal lesions.  Neck was without thyromegaly.  Lymphatics:  Negative cervical, supraclavicular or axillary adenopathy.  Respiratory: lungs were clear bilaterally without wheezing or crackles.  Cardiovascular:  Regular rate and rhythm, S1/S2, without murmur, rub or gallop.  There was no pedal edema.  GI:  abdomen was soft, flat, nontender, nondistended, without organomegaly.  Muscoloskeletal:  no spinal tenderness of palpation of vertebral spine.  Skin exam was without echymosis, petichae.  Neuro exam was nonfocal.  Patient was able to get on and off exam table without assistance.  Gait was normal.  Patient was alerted and oriented.  Attention was good.   Language was appropriate.  Mood was normal without depression.  Speech was not pressured.  Thought content was not tangential.     LABORATORY/RADIOLOGY DATA:  Lab Results  Component Value Date   WBC 9.1 04/21/2011   HGB 15.1 04/21/2011   HCT 43.5 04/21/2011   PLT 224 04/21/2011   GLUCOSE 76 02/03/2011   ALT 58* 02/03/2011   AST 42* 02/03/2011   NA 138 02/03/2011   K 4.4 02/03/2011   CL 101 02/03/2011   CREATININE 0.97 02/03/2011   BUN 21 02/03/2011  CO2 28 02/03/2011   INR 1.05 07/09/2010   IMAGING: I personally reviewed the following CT's images and showed the patient and his wife the pictures.  There was no evidence of recurrent lymphoma.  CT CHEST  Findings: No enlarged axillary or supraclavicular lymph nodes.  No enlarged mediastinal or hilar lymph nodes. No pericardial or  pleural effusions identified.  The lungs appear  clear.  The visualized osseous structures are unremarkable.  The no aggressive bone lesions noted.  IMPRESSION:  1. Negative for mass or adenopathy.  CT ABDOMEN AND PELVIS  Findings: Normal appearance of the spleen.  The adrenal glands are normal.  Normal appearance of the liver. The gallbladder is normal.  No biliary dilatation. Pancreas is normal.  Both adrenal glands are within normal limits. The right kidney is  normal. The left kidney is also normal. There is no enlarged  upper abdominal lymph nodes. There is no pelvic or inguinal  adenopathy.  The urinary bladder appears normal. The stomach and the small  bowel loops appear within normal limits.  The appendix is identified within the right lower quadrant.  The appendix is slightly increased in caliber measuring 0.9 cm,  image 104. There is mild appendiceal wall thickening.  No free fluid or fluid collections.  The colon appears normal.  Review of the visualized osseous structures is unremarkable.  IMPRESSION:  1. There is no mass or adenopathy within the abdomen or pelvis to  suggest residual or recurrent lymphoma.  2. Increased caliber of the appendix with mild wall thickening.  Correlate for any clinical signs or symptoms of appendicitis.  ASSESSMENT AND PLAN:   1. History of Hodgkin lymphoma:  I discussed with Mr. Zhen and his wife that he has no evidence of disease recurrence on clinical history, physical exam, and CT.  He has no residual side effects from chemo.  I recommended to continue watchful observation.  2. Surveillance:  I discussed with Mr. Matton that CT chest/abdomen/pel is recommended by [facility] guideline every 6 months.  Next one is due in October 2013 (to be ordered next visit).  3. Shooting sensation with neck flexion:  Most likely musculoskeletal.  No concern for lymphoma on exam, history, or CT.  I advised him to watch out for this symptom.  If it worsens or with paresthesia, weakness in the bilateral upper  extremities, I may consider referral him to neurosurgery.  4. Mild wall thickening of appendix.  Asymptomatic, afebrile, no leukocytosis.  Thus, most likely incidental finding.  Will continue observation.  If symptoms, I advised him to present to ED.   5. Follow up:  With Belenda Cruise, NP in about 3 months.    The length of time of the face-to-face encounter was 15 minutes. More than 50% of time was spent counseling and coordination of care.

## 2011-05-11 ENCOUNTER — Telehealth: Payer: Self-pay | Admitting: *Deleted

## 2011-05-11 NOTE — Telephone Encounter (Signed)
Patient called and moved his flush appts.  JMW

## 2011-05-26 ENCOUNTER — Ambulatory Visit (HOSPITAL_BASED_OUTPATIENT_CLINIC_OR_DEPARTMENT_OTHER): Payer: PRIVATE HEALTH INSURANCE

## 2011-05-26 VITALS — BP 134/77 | HR 67 | Temp 97.0°F

## 2011-05-26 DIAGNOSIS — Z452 Encounter for adjustment and management of vascular access device: Secondary | ICD-10-CM

## 2011-05-26 DIAGNOSIS — C8190 Hodgkin lymphoma, unspecified, unspecified site: Secondary | ICD-10-CM

## 2011-05-26 MED ORDER — SODIUM CHLORIDE 0.9 % IJ SOLN
10.0000 mL | INTRAMUSCULAR | Status: DC | PRN
  Administered 2011-05-26: 10 mL via INTRAVENOUS
  Filled 2011-05-26: qty 10

## 2011-05-26 MED ORDER — HEPARIN SOD (PORK) LOCK FLUSH 100 UNIT/ML IV SOLN
500.0000 [IU] | Freq: Once | INTRAVENOUS | Status: AC
  Administered 2011-05-26: 500 [IU] via INTRAVENOUS
  Filled 2011-05-26: qty 5

## 2011-05-26 NOTE — Patient Instructions (Addendum)
Any questions call MD

## 2011-06-10 ENCOUNTER — Other Ambulatory Visit: Payer: Self-pay | Admitting: Dermatology

## 2011-06-30 ENCOUNTER — Ambulatory Visit (HOSPITAL_BASED_OUTPATIENT_CLINIC_OR_DEPARTMENT_OTHER): Payer: PRIVATE HEALTH INSURANCE

## 2011-06-30 VITALS — BP 125/79 | HR 64 | Temp 98.0°F

## 2011-06-30 DIAGNOSIS — C8590 Non-Hodgkin lymphoma, unspecified, unspecified site: Secondary | ICD-10-CM

## 2011-06-30 DIAGNOSIS — C8589 Other specified types of non-Hodgkin lymphoma, extranodal and solid organ sites: Secondary | ICD-10-CM

## 2011-06-30 MED ORDER — HEPARIN SOD (PORK) LOCK FLUSH 100 UNIT/ML IV SOLN
500.0000 [IU] | Freq: Once | INTRAVENOUS | Status: AC
  Administered 2011-06-30: 500 [IU] via INTRAVENOUS
  Filled 2011-06-30: qty 5

## 2011-06-30 MED ORDER — SODIUM CHLORIDE 0.9 % IJ SOLN
10.0000 mL | INTRAMUSCULAR | Status: DC | PRN
  Administered 2011-06-30: 10 mL via INTRAVENOUS
  Filled 2011-06-30: qty 10

## 2011-07-26 ENCOUNTER — Telehealth: Payer: Self-pay | Admitting: *Deleted

## 2011-07-26 NOTE — Telephone Encounter (Signed)
VM from pt states needs order faxed to [facility] [phone]) to be retested to evaluate whether he needs to continue to store his sperm or not.   Called [facility] at ph [phone] and left VM requesting call back to clarify what order they need to retest pt.

## 2011-07-26 NOTE — Telephone Encounter (Signed)
Called pt and left him a VM to let him know I am waiting to hear back from [facility] to clarify what order they will need for pt to be retested. I will call him back when the order is sent.

## 2011-07-27 ENCOUNTER — Telehealth: Payer: Self-pay | Admitting: *Deleted

## 2011-07-27 NOTE — Telephone Encounter (Signed)
Faxed signed order to [facility] for semen analysis s/p chemo and radiation treatment.  Called pt and left him VM that order was sent and to call us back if he needs anything else.

## 2011-08-04 ENCOUNTER — Other Ambulatory Visit (HOSPITAL_BASED_OUTPATIENT_CLINIC_OR_DEPARTMENT_OTHER): Payer: PRIVATE HEALTH INSURANCE | Admitting: Lab

## 2011-08-04 ENCOUNTER — Ambulatory Visit: Payer: PRIVATE HEALTH INSURANCE

## 2011-08-04 ENCOUNTER — Telehealth: Payer: Self-pay | Admitting: Oncology

## 2011-08-04 ENCOUNTER — Encounter: Payer: Self-pay | Admitting: Oncology

## 2011-08-04 ENCOUNTER — Ambulatory Visit (HOSPITAL_BASED_OUTPATIENT_CLINIC_OR_DEPARTMENT_OTHER): Payer: PRIVATE HEALTH INSURANCE | Admitting: Oncology

## 2011-08-04 VITALS — BP 120/73 | HR 66 | Temp 98.3°F | Ht 72.0 in | Wt 158.3 lb

## 2011-08-04 VITALS — BP 119/71 | HR 59 | Temp 97.6°F

## 2011-08-04 DIAGNOSIS — C8190 Hodgkin lymphoma, unspecified, unspecified site: Secondary | ICD-10-CM

## 2011-08-04 DIAGNOSIS — C8590 Non-Hodgkin lymphoma, unspecified, unspecified site: Secondary | ICD-10-CM

## 2011-08-04 DIAGNOSIS — C8119 Nodular sclerosis classical Hodgkin lymphoma, extranodal and solid organ sites: Secondary | ICD-10-CM

## 2011-08-04 LAB — COMPREHENSIVE METABOLIC PANEL
ALT: 20 U/L (ref 0–53)
AST: 17 U/L (ref 0–37)
Albumin: 4.7 g/dL (ref 3.5–5.2)
Alkaline Phosphatase: 54 U/L (ref 39–117)
BUN: 18 mg/dL (ref 6–23)
Calcium: 9.9 mg/dL (ref 8.4–10.5)
Chloride: 102 mEq/L (ref 96–112)
Potassium: 4.3 mEq/L (ref 3.5–5.3)
Sodium: 140 mEq/L (ref 135–145)
Total Protein: 7.3 g/dL (ref 6.0–8.3)

## 2011-08-04 LAB — CBC WITH DIFFERENTIAL/PLATELET
Basophils Absolute: 0.1 10*3/uL (ref 0.0–0.1)
EOS%: 5.2 % (ref 0.0–7.0)
HGB: 14.9 g/dL (ref 13.0–17.1)
MCH: 30.1 pg (ref 27.2–33.4)
MONO%: 8.6 % (ref 0.0–14.0)
NEUT#: 4.8 10*3/uL (ref 1.5–6.5)
RBC: 4.96 10*6/uL (ref 4.20–5.82)
RDW: 12.4 % (ref 11.0–14.6)
lymph#: 1.2 10*3/uL (ref 0.9–3.3)

## 2011-08-04 MED ORDER — HEPARIN SOD (PORK) LOCK FLUSH 100 UNIT/ML IV SOLN
500.0000 [IU] | Freq: Once | INTRAVENOUS | Status: AC
  Administered 2011-08-04: 500 [IU] via INTRAVENOUS
  Filled 2011-08-04: qty 5

## 2011-08-04 MED ORDER — SODIUM CHLORIDE 0.9 % IJ SOLN
10.0000 mL | INTRAMUSCULAR | Status: DC | PRN
  Administered 2011-08-04: 10 mL via INTRAVENOUS
  Filled 2011-08-04: qty 10

## 2011-08-04 NOTE — Patient Instructions (Signed)
Call MD for problems 

## 2011-08-04 NOTE — Progress Notes (Signed)
[facility] OFFICE PROGRESS NOTE  Cc:  JOBE,DANIEL B., MD  DIAGNOSIS:  History of stage II Hodgkin lymphoma, nodular sclerosing syncytial variant; IPS score of 1 (given his gender);  low-risk features with normal CBC, LDH, and ESR.  PAST THERAPY:  ABVD d1 and 15 every 28 days x 4 cycles between July 17, 2010, and October 23, 2010.  He is s/p consolidative radiation therapy with [facility] finished in November 2012.   CURRENT THERAPY: watchful observation.   INTERVAL HISTORY: Gregory Mccarthy 41 y.o. male returns for regular follow up by himself. He reports feeling well. He denies night sweat, adenopathy, anorexia. He notes weight loss, but reports that he is eating healthier foods. He works full time without fatigue.  He denies abdominal pain, fever, diarrhea, constipation, bleeding.   Patient denies fatigue, headache, visual changes, confusion, drenching night sweats, palpable lymph node swelling, mucositis, odynophagia, dysphagia, nausea vomiting, jaundice, chest pain, palpitation, shortness of breath, dyspnea on exertion, productive cough, gum bleeding, epistaxis, hematemesis, hemoptysis, abdominal pain, abdominal swelling, early satiety, melena, hematochezia, hematuria, skin rash, spontaneous bleeding, joint swelling, joint pain, heat or cold intolerance, bowel bladder incontinence, back pain, focal motor weakness, paresthesia, depression, suicidal or homocidal ideation, feeling hopelessness.   MEDICAL HISTORY: Past Medical History  Diagnosis Date  . Hodgkin lymphoma   . Cancer     Hodgkins    SURGICAL HISTORY: History reviewed. No pertinent past surgical history.  MEDICATIONS: Current Outpatient Prescriptions  Medication Sig Dispense Refill  . Multiple Vitamin (MULTIVITAMIN) tablet Take 1 tablet by mouth daily.       No current facility-administered medications for this visit.   Facility-Administered Medications Ordered in Other Visits  Medication Dose Route Frequency  Provider Last Rate Last Dose  . heparin lock flush 100 unit/mL  500 Units Intravenous Once Exie Parody, MD   500 Units at 08/04/11 0948  . sodium chloride 0.9 % injection 10 mL  10 mL Intravenous PRN Exie Parody, MD   10 mL at 08/04/11 0942    ALLERGIES:   has no known allergies.  REVIEW OF SYSTEMS:  The rest of the 14-point review of system was negative.   Filed Vitals:   08/04/11 0850  BP: 120/73  Pulse: 66  Temp: 98.3 F (36.8 C)   Wt Readings from Last 3 Encounters:  08/04/11 158 lb 4.8 oz (71.804 kg)  04/21/11 164 lb 11.2 oz (74.707 kg)  02/03/11 164 lb 8 oz (74.617 kg)   ECOG Performance status: 0  PHYSICAL EXAMINATION:   General:  well-nourished in no acute distress.  Eyes:  no scleral icterus.  ENT:  There were no oropharyngeal lesions.  Neck was without thyromegaly.  Lymphatics:  Negative cervical, supraclavicular or axillary adenopathy.  Respiratory: lungs were clear bilaterally without wheezing or crackles.  Cardiovascular:  Regular rate and rhythm, S1/S2, without murmur, rub or gallop.  There was no pedal edema.  GI:  abdomen was soft, flat, nontender, nondistended, without organomegaly.  Muscoloskeletal:  no spinal tenderness of palpation of vertebral spine.  Skin exam was without echymosis, petichae.  Neuro exam was nonfocal.  Patient was able to get on and off exam table without assistance.  Gait was normal.  Patient was alerted and oriented.  Attention was good.   Language was appropriate.  Mood was normal without depression.  Speech was not pressured.  Thought content was not tangential.     LABORATORY/RADIOLOGY DATA:  Lab Results  Component Value Date   WBC 7.1  08/04/2011   HGB 14.9 08/04/2011   HCT 44.0 08/04/2011   PLT 233 08/04/2011   GLUCOSE 72 04/21/2011   ALT 18 04/21/2011   AST 16 04/21/2011   NA 142 04/21/2011   K 4.2 04/21/2011   CL 105 04/21/2011   CREATININE 1.13 04/21/2011   BUN 16 04/21/2011   CO2 25 04/21/2011   INR 1.05 07/09/2010    ASSESSMENT AND PLAN:    1. History of Hodgkin lymphoma:  I discussed with Gregory Mccarthy that he has no evidence of disease recurrence on clinical history, physical exam, and labs.  He has no residual side effects from chemo.  I recommended to continue watchful observation.  2. Surveillance:  I discussed with Gregory Mccarthy that CT chest/abdomen/pel is recommended by [facility] guideline every 6 months.  Next one is due in October 2013.  3. Mild wall thickening of appendix.  Asymptomatic, afebrile, no leukocytosis.  Thus, most likely incidental finding.  Will continue observation.  If symptoms, I advised him to present to ED.   4. Follow up:  In 3 months with labs and CT scan. Continue PAC flush every 6-8 weeks.   The length of time of the face-to-face encounter was 30 minutes. More than 50% of time was spent counseling and coordination of care.

## 2011-08-04 NOTE — Telephone Encounter (Signed)
gv pt appt schedule for August/October including ct scan for 10/14. Per pt his port is accessed when having his ct so no flush appt for October scheduled.

## 2011-09-03 NOTE — Progress Notes (Signed)
Left message with patient to call office for results from [facility].

## 2011-09-15 ENCOUNTER — Ambulatory Visit (HOSPITAL_BASED_OUTPATIENT_CLINIC_OR_DEPARTMENT_OTHER): Payer: PRIVATE HEALTH INSURANCE

## 2011-09-15 VITALS — BP 123/73 | HR 57 | Temp 98.1°F | Resp 18

## 2011-09-15 DIAGNOSIS — Z452 Encounter for adjustment and management of vascular access device: Secondary | ICD-10-CM

## 2011-09-15 DIAGNOSIS — C8190 Hodgkin lymphoma, unspecified, unspecified site: Secondary | ICD-10-CM

## 2011-09-15 MED ORDER — SODIUM CHLORIDE 0.9 % IJ SOLN
10.0000 mL | INTRAMUSCULAR | Status: DC | PRN
  Administered 2011-09-15: 10 mL via INTRAVENOUS
  Filled 2011-09-15: qty 10

## 2011-09-15 MED ORDER — HEPARIN SOD (PORK) LOCK FLUSH 100 UNIT/ML IV SOLN
500.0000 [IU] | Freq: Once | INTRAVENOUS | Status: AC
  Administered 2011-09-15: 500 [IU] via INTRAVENOUS
  Filled 2011-09-15: qty 5

## 2011-11-01 ENCOUNTER — Ambulatory Visit (HOSPITAL_COMMUNITY)
Admission: RE | Admit: 2011-11-01 | Discharge: 2011-11-01 | Disposition: A | Discharge status: Home / Self Care | Payer: PRIVATE HEALTH INSURANCE | Source: Ambulatory Visit | Attending: Oncology | Admitting: Oncology

## 2011-11-01 DIAGNOSIS — J3489 Other specified disorders of nose and nasal sinuses: Secondary | ICD-10-CM | POA: Insufficient documentation

## 2011-11-01 DIAGNOSIS — M47812 Spondylosis without myelopathy or radiculopathy, cervical region: Secondary | ICD-10-CM | POA: Insufficient documentation

## 2011-11-01 DIAGNOSIS — Z923 Personal history of irradiation: Secondary | ICD-10-CM | POA: Insufficient documentation

## 2011-11-01 DIAGNOSIS — M5030 Other cervical disc degeneration, unspecified cervical region: Secondary | ICD-10-CM | POA: Insufficient documentation

## 2011-11-01 DIAGNOSIS — R599 Enlarged lymph nodes, unspecified: Secondary | ICD-10-CM | POA: Insufficient documentation

## 2011-11-01 DIAGNOSIS — C8190 Hodgkin lymphoma, unspecified, unspecified site: Secondary | ICD-10-CM | POA: Insufficient documentation

## 2011-11-01 DIAGNOSIS — Z9221 Personal history of antineoplastic chemotherapy: Secondary | ICD-10-CM | POA: Insufficient documentation

## 2011-11-01 MED ORDER — IOHEXOL 300 MG/ML  SOLN
100.0000 mL | Freq: Once | INTRAMUSCULAR | Status: AC | PRN
  Administered 2011-11-01: 100 mL via INTRAVENOUS

## 2011-11-04 ENCOUNTER — Ambulatory Visit (HOSPITAL_BASED_OUTPATIENT_CLINIC_OR_DEPARTMENT_OTHER): Payer: PRIVATE HEALTH INSURANCE | Admitting: Oncology

## 2011-11-04 ENCOUNTER — Other Ambulatory Visit: Payer: Self-pay | Admitting: Radiology

## 2011-11-04 ENCOUNTER — Telehealth: Payer: Self-pay | Admitting: Oncology

## 2011-11-04 ENCOUNTER — Other Ambulatory Visit (HOSPITAL_BASED_OUTPATIENT_CLINIC_OR_DEPARTMENT_OTHER): Payer: PRIVATE HEALTH INSURANCE | Admitting: Lab

## 2011-11-04 VITALS — BP 122/72 | HR 67 | Temp 97.3°F | Resp 20 | Ht 72.0 in | Wt 162.0 lb

## 2011-11-04 DIAGNOSIS — C8190 Hodgkin lymphoma, unspecified, unspecified site: Secondary | ICD-10-CM

## 2011-11-04 DIAGNOSIS — N63 Unspecified lump in unspecified breast: Secondary | ICD-10-CM

## 2011-11-04 LAB — CBC WITH DIFFERENTIAL/PLATELET
BASO%: 0.9 % (ref 0.0–2.0)
HCT: 44 % (ref 38.4–49.9)
LYMPH%: 19.3 % (ref 14.0–49.0)
MCH: 30.4 pg (ref 27.2–33.4)
MCHC: 34.3 g/dL (ref 32.0–36.0)
MCV: 88.6 fL (ref 79.3–98.0)
MONO#: 0.5 10*3/uL (ref 0.1–0.9)
MONO%: 7.6 % (ref 0.0–14.0)
NEUT%: 69.1 % (ref 39.0–75.0)
Platelets: 206 10*3/uL (ref 140–400)
RBC: 4.97 10*6/uL (ref 4.20–5.82)
WBC: 6.4 10*3/uL (ref 4.0–10.3)

## 2011-11-04 LAB — LACTATE DEHYDROGENASE (CC13): LDH: 134 U/L (ref 125–220)

## 2011-11-04 LAB — COMPREHENSIVE METABOLIC PANEL (CC13)
ALT: 23 U/L (ref 0–55)
Alkaline Phosphatase: 60 U/L (ref 40–150)
CO2: 27 mEq/L (ref 22–29)
Creatinine: 1 mg/dL (ref 0.7–1.3)
Sodium: 141 mEq/L (ref 136–145)
Total Bilirubin: 0.7 mg/dL (ref 0.20–1.20)

## 2011-11-04 NOTE — Telephone Encounter (Signed)
Gave pt appt for April 2014 CT, gave pt oral contrast, NPO after midnight,pt will have portacath removal on next week per [facility] @ [facility], pt given instructions. Pt will see ML after CT

## 2011-11-04 NOTE — Patient Instructions (Addendum)
A.  CT result:  CT NECK WITH CONTRAST   Findings: Interval improvement in cervical adenopathy. There is  cervical adenopathy which was hypermetabolic on the initial staging  PET scan.   Right cervical lymph nodes are smaller. Many of these lymph nodes  persist. 4 x 6 mm lymph node lateral to the jugular vein is  smaller. Additional small jugular digastric nodes have improved.  Multiple small nodes posterior to the jugular vein on the right  also are smaller, the largest is 5 x 8 mm. Left jugular digastric  node has improved and now measures 7 x 12 mm. Small posterior  lymph nodes also have improved. No new adenopathy.  Mucous retention cyst in the left maxillary sinus is smaller  compared with the prior study. Tongue and tonsils are normal.  Epiglottis and larynx are normal. The thyroid is normal.  Submandibular and parotid glands are normal bilaterally.  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 No acute bony lesion.   IMPRESSION:  Improvement in cervical adenopathy bilaterally. Multiple small  nodes are present bilaterally, right greater than left. No  pathologically enlarged lymph nodes. The largest lymph node is a  left jugular digastric node which currently measures 7 x 12 mm.   CT CHEST  Findings: No pathologically enlarged mediastinal, hilar, axillary  or supraclavicular lymph nodes. Heart size normal. No pericardial  effusion.  Lungs are clear. No pleural fluid. Airway is unremarkable.   IMPRESSION:  No evidence of recurrent lymphoma.   CT ABDOMEN AND PELVIS  Findings: Liver, gallbladder, adrenal glands and right kidney are  unremarkable. There may be a sub centimeter low attenuation lesion  in the interpolar left kidney, too small to characterize. Kidneys,  spleen, pancreas, stomach and bowel are otherwise unremarkable.  No pathologically enlarged lymph nodes. No free fluid. No  worrisome lytic or sclerotic lesions.   IMPRESSION:  No evidence of recurrent  lymphoma in the abdomen or pelvis.  B.  Plan:  Return visit in about 6 months.

## 2011-11-04 NOTE — Progress Notes (Signed)
[facility]  Telephone:([phone] Fax:([phone]   OFFICE PROGRESS NOTE   Cc:  JOBE,DANIEL B., MD  DIAGNOSIS: History of stage II Hodgkin lymphoma, nodular sclerosing syncytial variant; IPS score of 1 (given his gender); low-risk features with normal CBC, LDH, and ESR.   PAST THERAPY: ABVD d1 and 15 every 28 days x 4 cycles between July 17, 2010, and October 23, 2010. He is s/p consolidative radiation therapy with [facility] finished in November 2012.   CURRENT THERAPY: watchful observation.   INTERVAL HISTORY: Gregory Mccarthy 41 y.o. male returns for regular follow up with his wife.  He reports feeling well.  He was mowing grass last week and started feeling a left axillary nodule.  He does not feel adenopathy elsewhere.   Patient denies fever, anorexia, weight loss, fatigue, headache, visual changes, confusion, drenching night sweats, mucositis, odynophagia, dysphagia, nausea vomiting, jaundice, chest pain, palpitation, shortness of breath, dyspnea on exertion, productive cough, gum bleeding, epistaxis, hematemesis, hemoptysis, abdominal pain, abdominal swelling, early satiety, melena, hematochezia, hematuria, skin rash, spontaneous bleeding, joint swelling, joint pain, heat or cold intolerance, bowel bladder incontinence, back pain, focal motor weakness, paresthesia, depression, suicidal or homicidal ideation, feeling hopelessness.   Past Medical History  Diagnosis Date  . Hodgkin lymphoma   . Cancer     Hodgkins    No past surgical history on file.  Current Outpatient Prescriptions  Medication Sig Dispense Refill  . Multiple Vitamin (MULTIVITAMIN) tablet Take 1 tablet by mouth daily.        ALLERGIES:   has no known allergies.  REVIEW OF SYSTEMS:  The rest of the 14-point review of system was negative.   Filed Vitals:   11/04/11 0859  BP: 122/72  Pulse: 67  Temp: 97.3 F (36.3 C)  Resp: 20   Wt Readings from Last 3 Encounters:  11/04/11 162 lb  (73.483 kg)  08/04/11 158 lb 4.8 oz (71.804 kg)  04/21/11 164 lb 11.2 oz (74.707 kg)   ECOG Performance status: 0  PHYSICAL EXAMINATION:   General:  well-nourished in no acute distress.  Eyes:  no scleral icterus.  ENT:  There were no oropharyngeal lesions.  Neck was without thyromegaly.  Lymphatics:  Negative cervical, supraclavicular or axillary adenopathy.  I could not feel any left axillary adenopathy.   Respiratory: lungs were clear bilaterally without wheezing or crackles.  Cardiovascular:  Regular rate and rhythm, S1/S2, without murmur, rub or gallop.  There was no pedal edema.  GI:  abdomen was soft, flat, nontender, nondistended, without organomegaly.  Muscoloskeletal:  no spinal tenderness of palpation of vertebral spine.  Skin exam was without echymosis, petichae.  Neuro exam was nonfocal.  Patient was able to get on and off exam table without assistance.  Gait was normal.  Patient was alerted and oriented.  Attention was good.   Language was appropriate.  Mood was normal without depression.  Speech was not pressured.  Thought content was not tangential.     LABORATORY/RADIOLOGY DATA:  Lab Results  Component Value Date   WBC 6.4 11/04/2011   HGB 15.1 11/04/2011   HCT 44.0 11/04/2011   PLT 206 11/04/2011   GLUCOSE 62* 11/04/2011   ALKPHOS 60 11/04/2011   ALT 23 11/04/2011   AST 18 11/04/2011   NA 141 11/04/2011   K 4.2 11/04/2011   CL 104 11/04/2011   CREATININE 1.0 11/04/2011   BUN 17.0 11/04/2011   CO2 27 11/04/2011   INR 1.05 07/09/2010   IMAGING:  I personally reviewed the following CT scan and showed the images to the patient and his wife.  Ct Soft Tissue Neck W Contrast  11/01/2011  *RADIOLOGY REPORT*  Clinical Data: Hodgkin's disease.  Chemo radiation complete  CT NECK WITH CONTRAST  Technique:  Multidetector CT imaging of the neck was performed with intravenous contrast.  Contrast: OMNIPAQUE IOHEXOL 300 MG/ML  SOLN  Comparison: CT neck 07/08/2010, PET  09/22/2010  Findings: Interval improvement in cervical adenopathy.  There is cervical adenopathy which was hypermetabolic on the initial staging PET scan.  Right cervical lymph nodes are smaller.  Many of these lymph nodes persist.  4 x 6 mm lymph node lateral to the jugular vein is smaller.  Additional small jugular digastric nodes have improved. Multiple small nodes posterior to the jugular vein on the right also are smaller, the largest is 5 x 8 mm.  Left jugular digastric node has improved and  now measures 7 x 12 mm.  Small posterior lymph nodes also have  improved.  No new adenopathy.  Mucous retention cyst in the left maxillary sinus is smaller compared with the prior study.  Tongue and tonsils are normal. Epiglottis and larynx are normal.  The thyroid is normal. Submandibular and parotid glands are normal bilaterally.  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  No acute bony lesion.  IMPRESSION: Improvement in cervical adenopathy bilaterally.  Multiple small nodes are present bilaterally, right greater than left.  No pathologically enlarged lymph nodes.  The largest lymph node is a left jugular digastric node which currently measures 7 x 12 mm.   Original Report Authenticated By: Camelia Phenes, M.D.    Ct Chest W Contrast  11/01/2011  *RADIOLOGY REPORT*  Clinical Data:  Lymphoma.  CT CHEST, ABDOMEN AND PELVIS WITH CONTRAST  Technique:  Multidetector CT imaging of the chest, abdomen and pelvis was performed following the standard protocol during bolus administration of intravenous contrast.  Contrast: OMNIPAQUE IOHEXOL 300 MG/ML  SOLN  Comparison:  04/19/2011.  CT CHEST  Findings:  No pathologically enlarged mediastinal, hilar, axillary or supraclavicular lymph nodes.  Heart size normal.  No pericardial effusion.  Lungs are clear.  No pleural fluid.  Airway is unremarkable.  IMPRESSION: No evidence of recurrent lymphoma.  CT ABDOMEN AND PELVIS  Findings:  Liver, gallbladder, adrenal glands and  right kidney are unremarkable.  There may be a sub centimeter low attenuation lesion in the interpolar left kidney, too small to characterize.  Kidneys, spleen, pancreas, stomach and bowel are otherwise unremarkable.  No pathologically enlarged lymph nodes.  No free fluid.  No worrisome lytic or sclerotic lesions.  IMPRESSION: No evidence of recurrent lymphoma in the abdomen or pelvis.   Original Report Authenticated By: Reyes Ivan, M.D.    Ct Abdomen Pelvis W Contrast  11/01/2011  *RADIOLOGY REPORT*  Clinical Data:  Lymphoma.  CT CHEST, ABDOMEN AND PELVIS WITH CONTRAST  Technique:  Multidetector CT imaging of the chest, abdomen and pelvis was performed following the standard protocol during bolus administration of intravenous contrast.  Contrast: OMNIPAQUE IOHEXOL 300 MG/ML  SOLN  Comparison:  04/19/2011.  CT CHEST  Findings:  No pathologically enlarged mediastinal, hilar, axillary or supraclavicular lymph nodes.  Heart size normal.  No pericardial effusion.  Lungs are clear.  No pleural fluid.  Airway is unremarkable.  IMPRESSION: No evidence of recurrent lymphoma.  CT ABDOMEN AND PELVIS  Findings:  Liver, gallbladder, adrenal glands and right kidney are unremarkable.  There may  be a sub centimeter low attenuation lesion in the interpolar left kidney, too small to characterize.  Kidneys, spleen, pancreas, stomach and bowel are otherwise unremarkable.  No pathologically enlarged lymph nodes.  No free fluid.  No worrisome lytic or sclerotic lesions.  IMPRESSION: No evidence of recurrent lymphoma in the abdomen or pelvis.   Original Report Authenticated By: Reyes Ivan, M.D.       ASSESSMENT AND PLAN:   1. History of Hodgkin lymphoma: I discussed with Gregory Mccarthy and his wife that he has no evidence of disease recurrence on clinical history, physical exam, and CT. He has no residual side effects from chemo. I recommended to continue watchful observation.  2. Surveillance: I discussed  with Gregory Mccarthy that CT chest/abdomen/pel is recommended by [facility] guideline every 6 months. Next one is due in March/April 2014.  I ordered this today.  3. Left axillary nodule:  Most likely a subcutaneous cyst.   4. Mild wall thickening of appendix. Asymptomatic, afebrile, no leukocytosis. Thus, most likely incidental finding. This resolved on CT abdomen today.  5. Follow up: With Belenda Cruise, NP in about 6 months with CT neck/chest/abd/pel.     The length of time of the face-to-face encounter was 15 minutes. More than 50% of time was spent counseling and coordination of care.

## 2011-11-08 ENCOUNTER — Encounter (HOSPITAL_COMMUNITY): Payer: Self-pay | Admitting: Pharmacy Technician

## 2011-11-09 ENCOUNTER — Ambulatory Visit (HOSPITAL_COMMUNITY)
Admission: RE | Admit: 2011-11-09 | Discharge: 2011-11-09 | Disposition: A | Discharge status: Home / Self Care | Payer: PRIVATE HEALTH INSURANCE | Source: Ambulatory Visit | Attending: Oncology | Admitting: Oncology

## 2011-11-09 VITALS — BP 113/67 | HR 61 | Temp 97.9°F | Resp 14

## 2011-11-09 DIAGNOSIS — Z452 Encounter for adjustment and management of vascular access device: Secondary | ICD-10-CM | POA: Insufficient documentation

## 2011-11-09 DIAGNOSIS — C8190 Hodgkin lymphoma, unspecified, unspecified site: Secondary | ICD-10-CM

## 2011-11-09 LAB — CBC
HCT: 43.3 % (ref 39.0–52.0)
Hemoglobin: 14.4 g/dL (ref 13.0–17.0)
MCH: 29 pg (ref 26.0–34.0)
RBC: 4.97 MIL/uL (ref 4.22–5.81)

## 2011-11-09 LAB — PROTIME-INR
INR: 0.98 (ref 0.00–1.49)
Prothrombin Time: 12.9 seconds (ref 11.6–15.2)

## 2011-11-09 LAB — APTT: aPTT: 33 seconds (ref 24–37)

## 2011-11-09 MED ORDER — MIDAZOLAM HCL 2 MG/2ML IJ SOLN
INTRAMUSCULAR | Status: AC
  Filled 2011-11-09: qty 4

## 2011-11-09 MED ORDER — CEFAZOLIN SODIUM 1-5 GM-% IV SOLN
1.0000 g | INTRAVENOUS | Status: AC
  Administered 2011-11-09: 1 g via INTRAVENOUS
  Filled 2011-11-09: qty 50

## 2011-11-09 MED ORDER — FENTANYL CITRATE 0.05 MG/ML IJ SOLN
INTRAMUSCULAR | Status: AC
  Filled 2011-11-09: qty 4

## 2011-11-09 MED ORDER — SODIUM CHLORIDE 0.9 % IV SOLN: INTRAVENOUS | Status: DC

## 2011-11-09 MED ORDER — FENTANYL CITRATE 0.05 MG/ML IJ SOLN
INTRAMUSCULAR | Status: AC | PRN
  Administered 2011-11-09: 100 ug via INTRAVENOUS

## 2011-11-09 MED ORDER — LIDOCAINE HCL 1 % IJ SOLN
INTRAMUSCULAR | Status: AC
  Filled 2011-11-09: qty 20

## 2011-11-09 MED ORDER — MIDAZOLAM HCL 2 MG/2ML IJ SOLN
INTRAMUSCULAR | Status: AC | PRN
  Administered 2011-11-09: 2 mg via INTRAVENOUS

## 2011-11-09 NOTE — Procedures (Signed)
R IJ port removal No complication No blood loss. See complete dictation in [facility] PACS.  

## 2011-11-09 NOTE — H&P (Signed)
Gregory Mccarthy is an 41 y.o. male.   Chief Complaint: "I'm here to get my port out" HPI: Patient with history of Hodgkin's lymphoma, s/p completion of therapy, who presents today for port a cath removal.  Past Medical History  Diagnosis Date  . Hodgkin lymphoma   . Cancer     Hodgkins    No past surgical history on file.  No family history on file. Social History:  reports that he has never smoked. He does not have any smokeless tobacco history on file. His alcohol and drug histories not on file.  Allergies: No Known Allergies  Current outpatient prescriptions:Multiple Vitamin (MULTIVITAMIN) tablet, Take 1 tablet by mouth daily., Disp: , Rfl:  Current facility-administered medications:0.9 %  sodium chloride infusion, , Intravenous, Continuous, [name], PA;  ceFAZolin (ANCEF) IVPB 1 g/50 mL premix, 1 g, Intravenous, On Call, [name], PA   Results for orders placed during the hospital encounter of 11/09/11 (from the past 48 hour(s))  APTT     Status: Normal   Collection Time   11/09/11 10:00 AM      Component Value Range Comment   aPTT 33  24 - 37 seconds   CBC     Status: Normal   Collection Time   11/09/11 10:00 AM      Component Value Range Comment   WBC 6.2  4.0 - 10.5 K/uL    RBC 4.97  4.22 - 5.81 MIL/uL    Hemoglobin 14.4  13.0 - 17.0 g/dL    HCT 21.3  08.6 - 57.8 %    MCV 87.1  78.0 - 100.0 fL    MCH 29.0  26.0 - 34.0 pg    MCHC 33.3  30.0 - 36.0 g/dL    RDW 46.9  62.9 - 52.8 %    Platelets 236  150 - 400 K/uL   PROTIME-INR     Status: Normal   Collection Time   11/09/11 10:00 AM      Component Value Range Comment   Prothrombin Time 12.9  11.6 - 15.2 seconds    INR 0.98  0.00 - 1.49    No results found.  Review of Systems  Constitutional: Negative for fever and chills.  Respiratory: Negative for cough and shortness of breath.   Cardiovascular: Negative for chest pain.  Gastrointestinal: Negative for nausea, vomiting and abdominal pain.    Musculoskeletal: Negative for back pain.  Neurological: Negative for headaches.  Endo/Heme/Allergies: Does not bruise/bleed easily.    Blood pressure 110/65, pulse 59, temperature 97.9 F (36.6 C), SpO2 98.00%. Physical Exam  Constitutional: He is oriented to person, place, and time. He appears well-developed and well-nourished.  Cardiovascular: Normal rate and regular rhythm.   Respiratory: Effort normal and breath sounds normal.  GI: Soft. Bowel sounds are normal. There is no tenderness.  Musculoskeletal: Normal range of motion. He exhibits no edema.  Neurological: He is alert and oriented to person, place, and time.     Assessment/Plan Patient s/p completion of therapy for Hodgkin's lymphoma. Plan is for removal of port a cath today. Details/risks of procedure d/w pt with his understanding and consent.  Gregory Mccarthy,D KEVIN 11/09/2011, 11:00 AM

## 2012-05-03 ENCOUNTER — Telehealth: Payer: Self-pay | Admitting: *Deleted

## 2012-05-03 NOTE — Telephone Encounter (Signed)
Request from [place] in CT to contact pt.   [employer] requesting call from pt to let them know he does want his CT scan done at [facility]   S/w pt's wife and got pt's cell number.  S/w pt and gave him # for [employer] [phone],  Case #[id number].  He says he does want scan done at [facility] and in fact has already paid for his part.  He will call number I gave him and plans on keeping his CT appt as scheduled in the morning.

## 2012-05-04 ENCOUNTER — Ambulatory Visit (HOSPITAL_COMMUNITY)
Admission: RE | Admit: 2012-05-04 | Discharge: 2012-05-04 | Disposition: A | Discharge status: Home / Self Care | Payer: 59 | Source: Ambulatory Visit | Attending: Oncology | Admitting: Oncology

## 2012-05-04 ENCOUNTER — Encounter (HOSPITAL_COMMUNITY): Payer: Self-pay

## 2012-05-04 ENCOUNTER — Ambulatory Visit: Payer: PRIVATE HEALTH INSURANCE | Admitting: Oncology

## 2012-05-04 DIAGNOSIS — M47812 Spondylosis without myelopathy or radiculopathy, cervical region: Secondary | ICD-10-CM | POA: Insufficient documentation

## 2012-05-04 DIAGNOSIS — Z9221 Personal history of antineoplastic chemotherapy: Secondary | ICD-10-CM | POA: Insufficient documentation

## 2012-05-04 DIAGNOSIS — R599 Enlarged lymph nodes, unspecified: Secondary | ICD-10-CM | POA: Insufficient documentation

## 2012-05-04 DIAGNOSIS — Z923 Personal history of irradiation: Secondary | ICD-10-CM | POA: Insufficient documentation

## 2012-05-04 DIAGNOSIS — C8190 Hodgkin lymphoma, unspecified, unspecified site: Secondary | ICD-10-CM

## 2012-05-04 MED ORDER — IOHEXOL 300 MG/ML  SOLN
100.0000 mL | Freq: Once | INTRAMUSCULAR | Status: AC | PRN
  Administered 2012-05-04: 100 mL via INTRAVENOUS

## 2012-05-05 ENCOUNTER — Encounter: Payer: Self-pay | Admitting: Oncology

## 2012-05-05 ENCOUNTER — Other Ambulatory Visit (HOSPITAL_BASED_OUTPATIENT_CLINIC_OR_DEPARTMENT_OTHER): Payer: 59

## 2012-05-05 ENCOUNTER — Telehealth: Payer: Self-pay | Admitting: Oncology

## 2012-05-05 ENCOUNTER — Ambulatory Visit (HOSPITAL_BASED_OUTPATIENT_CLINIC_OR_DEPARTMENT_OTHER): Payer: 59 | Admitting: Oncology

## 2012-05-05 VITALS — BP 122/74 | HR 56 | Temp 97.2°F | Resp 20 | Ht 72.0 in | Wt 164.8 lb

## 2012-05-05 DIAGNOSIS — C8190 Hodgkin lymphoma, unspecified, unspecified site: Secondary | ICD-10-CM

## 2012-05-05 LAB — CBC WITH DIFFERENTIAL/PLATELET
Basophils Absolute: 0.1 10*3/uL (ref 0.0–0.1)
Eosinophils Absolute: 0.2 10*3/uL (ref 0.0–0.5)
HGB: 15.2 g/dL (ref 13.0–17.1)
LYMPH%: 20.7 % (ref 14.0–49.0)
MONO#: 0.5 10*3/uL (ref 0.1–0.9)
NEUT#: 3.9 10*3/uL (ref 1.5–6.5)
Platelets: 211 10*3/uL (ref 140–400)
RBC: 5.08 10*6/uL (ref 4.20–5.82)
RDW: 12.8 % (ref 11.0–14.6)
WBC: 5.8 10*3/uL (ref 4.0–10.3)

## 2012-05-05 LAB — COMPREHENSIVE METABOLIC PANEL (CC13)
Albumin: 4 g/dL (ref 3.5–5.0)
BUN: 15.7 mg/dL (ref 7.0–26.0)
CO2: 28 mEq/L (ref 22–29)
Calcium: 9.5 mg/dL (ref 8.4–10.4)
Glucose: 75 mg/dl (ref 70–99)
Potassium: 3.9 mEq/L (ref 3.5–5.1)
Sodium: 143 mEq/L (ref 136–145)
Total Protein: 7.4 g/dL (ref 6.4–8.3)

## 2012-05-05 LAB — LACTATE DEHYDROGENASE (CC13): LDH: 120 U/L — ABNORMAL LOW (ref 125–245)

## 2012-05-05 NOTE — Progress Notes (Signed)
[facility]  Telephone:(336) [phone] Fax:(336) [phone]   OFFICE PROGRESS NOTE   Cc:  JOBE,DANIEL B., MD  DIAGNOSIS: History of stage II Hodgkin lymphoma, nodular sclerosing syncytial variant; IPS score of 1 (given his gender); low-risk features with normal CBC, LDH, and ESR.   PAST THERAPY: ABVD d1 and 15 every 28 days x 4 cycles between July 17, 2010, and October 23, 2010. He is s/p consolidative radiation therapy with [facility] finished in November 2012.   CURRENT THERAPY: watchful observation.   INTERVAL HISTORY: Gregory Mccarthy 42 y.o. male returns for regular follow up by himself.  He reports feeling well.  He is working full-time. He lifts weights and plays ice hickey routinely.    Patient denies fever, anorexia, weight loss, fatigue, headache, visual changes, confusion, drenching night sweats, mucositis, odynophagia, dysphagia, nausea vomiting, jaundice, chest pain, palpitation, shortness of breath, dyspnea on exertion, productive cough, gum bleeding, epistaxis, hematemesis, hemoptysis, abdominal pain, abdominal swelling, early satiety, melena, hematochezia, hematuria, skin rash, spontaneous bleeding, joint swelling, joint pain, heat or cold intolerance, bowel bladder incontinence, back pain, focal motor weakness, paresthesia, depression, suicidal or homicidal ideation, feeling hopelessness.   Past Medical History  Diagnosis Date  . Hodgkin lymphoma   . Cancer     Hodgkins    History reviewed. No pertinent past surgical history.  Current Outpatient Prescriptions  Medication Sig Dispense Refill  . Multiple Vitamin (MULTIVITAMIN) tablet Take 1 tablet by mouth daily.       No current facility-administered medications for this visit.    ALLERGIES:  has No Known Allergies.  REVIEW OF SYSTEMS:  The rest of the 14-point review of system was negative.   Filed Vitals:   05/05/12 0837  BP: 122/74  Pulse: 56  Temp: 97.2 F (36.2 C)  Resp: 20   Wt Readings from  Last 3 Encounters:  05/05/12 164 lb 12.8 oz (74.753 kg)  11/04/11 162 lb (73.483 kg)  08/04/11 158 lb 4.8 oz (71.804 kg)   ECOG Performance status: 0  PHYSICAL EXAMINATION:   General:  well-nourished in no acute distress.  Eyes:  no scleral icterus.  ENT:  There were no oropharyngeal lesions.  Neck was without thyromegaly.  Lymphatics:  Negative cervical, supraclavicular or axillary adenopathy.  I could not feel any left axillary adenopathy.   Respiratory: lungs were clear bilaterally without wheezing or crackles.  Cardiovascular:  Regular rate and rhythm, S1/S2, without murmur, rub or gallop.  There was no pedal edema.  GI:  abdomen was soft, flat, nontender, nondistended, without organomegaly.  Muscoloskeletal:  no spinal tenderness of palpation of vertebral spine.  Skin exam was without echymosis, petichae.  Neuro exam was nonfocal.  Patient was able to get on and off exam table without assistance.  Gait was normal.  Patient was alerted and oriented.  Attention was good.   Language was appropriate.  Mood was normal without depression.  Speech was not pressured.  Thought content was not tangential.     LABORATORY/RADIOLOGY DATA:  Lab Results  Component Value Date   WBC 5.8 05/05/2012   HGB 15.2 05/05/2012   HCT 45.2 05/05/2012   PLT 211 05/05/2012   GLUCOSE 75 05/05/2012   ALKPHOS 72 05/05/2012   ALT 16 05/05/2012   AST 17 05/05/2012   NA 143 05/05/2012   K 3.9 05/05/2012   CL 104 05/05/2012   CREATININE 1.0 05/05/2012   BUN 15.7 05/05/2012   CO2 28 05/05/2012   INR 0.98 11/09/2011  IMAGING:    CT NECK WITH CONTRAST  Technique: Multidetector CT imaging of the neck was performed with  intravenous contrast.  Contrast: OMNIPAQUE IOHEXOL 300 MG/ML SOLN  Comparison: 11/01/2011.  Findings: Continued normalization of previously identified  hypermetabolic cervical adenopathy. Right and left level II lymph  nodes measure short axis 5 and 6 mm respectively. Shotty bilateral  level five  adenopathy. No new areas of nodal involvement.  Mild spondylosis at C5-6, unchanged. Normal major and minor  salivary glands. No mucosal lesion. No retropharyngeal soft  tissue swelling. Craniocervical vasculature patent without gross  narrowing. Visualized intracranial compartment unremarkable.  Stable retention cyst left maxillary sinus. No thyroid lesions.  Normal larynx and supraglottic soft tissues.  Please see chest CT dictation for formal chest report.  IMPRESSION:  Stable Hodgkin's lymphoma.  Original Report Authenticated By: Davonna Belling, M.D.  CT CHEST, ABDOMEN AND PELVIS WITH CONTRAST  Technique: Multidetector CT imaging of the chest, abdomen and  pelvis was performed following the standard protocol during bolus  administration of intravenous contrast.  Contrast: OMNIPAQUE IOHEXOL 300 MG/ML SOLN  Comparison: 11/01/2011  CT CHEST  Findings: There is no pleural effusion identified. No suspicious  pulmonary nodule or mass. No enlarged mediastinal or hilar lymph  nodes. Heart size appears normal. There is no pericardial  effusion. No enlarged axillary or supraclavicular lymph nodes.  Review of the visualized bony structures is unremarkable.  IMPRESSION:  1. No specific features to suggest residual or recurrent tumor  within the chest.  CT ABDOMEN AND PELVIS  Findings: There is no focal liver abnormality. The gallbladder  appears normal. No biliary dilatation. Normal appearance of the  pancreas. The spleen is normal.  Normal appearance of the adrenal glands. The right kidney is  normal. The left kidney is normal. Urinary bladder is within  normal limits. Prostate gland and seminal vesicles appear normal.  There are no enlarged upper abdominal lymph nodes. There is no  pelvic or inguinal adenopathy. No free fluid or fluid collections  within the abdomen or pelvis.  The stomach and small bowel loops are unremarkable. The appendix  is visualized and appears normal.  Normal appearance of the colon.  The review of the visualized bony structures is unremarkable.  IMPRESSION:  1. No mass or adenopathy within the abdomen or pelvis.  Original Report Authenticated By: Signa Kell, M.D.   ASSESSMENT AND PLAN:   1. History of Hodgkin lymphoma: I discussed with Mr. Carriker that he has no evidence of disease recurrence on clinical history, physical exam, and CT. He has no residual side effects from chemo. I recommended to continue watchful observation.  2. Surveillance: I discussed with Mr. Molinari that CT chest/abdomen/pel is recommended by [facility] guideline every 6 months. Next one is due in October 2014.  I ordered this today.   3. Follow up: In about 6 months with CT neck/chest/abd/pel.     The length of time of the face-to-face encounter was 30 minutes. More than 50% of time was spent counseling and coordination of care.

## 2012-05-05 NOTE — Telephone Encounter (Signed)
gv and printed appt schedule, avs and barium to pt...pt awre cs will contact with d/t for ct.Marland KitchenMarland KitchenMarland Kitchen

## 2012-06-15 ENCOUNTER — Other Ambulatory Visit: Payer: Self-pay | Admitting: Dermatology

## 2012-10-23 ENCOUNTER — Telehealth: Payer: Self-pay | Admitting: Hematology and Oncology

## 2012-10-23 NOTE — Telephone Encounter (Signed)
Moved 10/17 appt to 10/16 @ 9am w/NG. lmonvm for pt re change w/new d/t for 10/17. Also confirmed lb/ct 10/13 and mailed.

## 2012-10-25 ENCOUNTER — Telehealth: Payer: Self-pay | Admitting: *Deleted

## 2012-10-25 NOTE — Telephone Encounter (Signed)
Pt called to cancel his office visit.  States has transferred his care to Med Onc at [facility] for now, since he was seeing Rad Onc at [facility] anyway and Dr. Gaylyn Rong had left our practice.  Pt would like to keep his CT Scan appt here as scheduled next week.   Instructed pt will cancel his lab and office visit.  Instructed him to request CT on disc to take back to [facility].  Also call us if he needs any future appts..   He states he is doing well and verbalized understanding.

## 2012-10-27 ENCOUNTER — Telehealth: Payer: Self-pay | Admitting: *Deleted

## 2012-10-27 NOTE — Telephone Encounter (Signed)
Left VM for pt that his CT scan has been r/s to 10/17 due to not being pre-certed yet.

## 2012-10-30 ENCOUNTER — Other Ambulatory Visit: Payer: 59 | Admitting: Lab

## 2012-10-30 ENCOUNTER — Ambulatory Visit (HOSPITAL_COMMUNITY): Payer: 59

## 2012-11-02 ENCOUNTER — Ambulatory Visit: Payer: 59

## 2012-11-03 ENCOUNTER — Ambulatory Visit: Payer: 59 | Admitting: Hematology and Oncology

## 2012-11-03 ENCOUNTER — Encounter (HOSPITAL_COMMUNITY): Payer: Self-pay

## 2012-11-03 ENCOUNTER — Ambulatory Visit (HOSPITAL_COMMUNITY)
Admission: RE | Admit: 2012-11-03 | Discharge: 2012-11-03 | Disposition: A | Discharge status: Home / Self Care | Payer: 59 | Source: Ambulatory Visit | Attending: Oncology | Admitting: Oncology

## 2012-11-03 DIAGNOSIS — J3489 Other specified disorders of nose and nasal sinuses: Secondary | ICD-10-CM | POA: Insufficient documentation

## 2012-11-03 DIAGNOSIS — C8190 Hodgkin lymphoma, unspecified, unspecified site: Secondary | ICD-10-CM | POA: Insufficient documentation

## 2012-11-03 DIAGNOSIS — Z9221 Personal history of antineoplastic chemotherapy: Secondary | ICD-10-CM | POA: Insufficient documentation

## 2012-11-03 DIAGNOSIS — Z923 Personal history of irradiation: Secondary | ICD-10-CM | POA: Insufficient documentation

## 2012-11-03 MED ORDER — IOHEXOL 300 MG/ML  SOLN
100.0000 mL | Freq: Once | INTRAMUSCULAR | Status: AC | PRN
  Administered 2012-11-03: 100 mL via INTRAVENOUS

## 2012-11-10 ENCOUNTER — Telehealth: Payer: Self-pay | Admitting: *Deleted

## 2012-11-10 NOTE — Telephone Encounter (Signed)
Message copied by Wende Mott on Fri Nov 10, 2012  3:13 PM ------      Message from: Myrtis Ser      Created: Fri Nov 10, 2012  1:38 PM       Please call pt and be sure that he has f/u with [facility] and that he has brought CT scan to be reviewed by [facility].            Thanks ------

## 2012-11-10 NOTE — Telephone Encounter (Signed)
Left VM on pt's cell phone instructing him to f/u w/ [facility] for his CT results and to please call back if any questions.

## 2012-11-12 ENCOUNTER — Encounter (HOSPITAL_COMMUNITY): Payer: Self-pay | Admitting: Emergency Medicine

## 2012-11-12 ENCOUNTER — Ambulatory Visit (HOSPITAL_COMMUNITY): Payer: 59 | Attending: Family Medicine

## 2012-11-12 ENCOUNTER — Emergency Department (INDEPENDENT_AMBULATORY_CARE_PROVIDER_SITE_OTHER)
Admission: EM | Admit: 2012-11-12 | Discharge: 2012-11-12 | Disposition: A | Discharge status: Home / Self Care | Payer: 59 | Source: Home / Self Care | Attending: Family Medicine | Admitting: Family Medicine

## 2012-11-12 DIAGNOSIS — J189 Pneumonia, unspecified organism: Secondary | ICD-10-CM

## 2012-11-12 MED ORDER — LEVOFLOXACIN 500 MG PO TABS: 500.0000 mg | ORAL_TABLET | Freq: Every day | ORAL | Status: DC

## 2012-11-12 NOTE — ED Provider Notes (Signed)
Gregory Mccarthy is a 42 y.o. male who presents to Urgent Care today for fever sore throat and nonproductive cough. Patient has 3 days of persistent fevers around 101.  He notes his sore throat was present for the first 2 days if not resolved. He's developed a mild productive cough. Ibuprofen helps with fever, however his fever returned on wears off. Additionally he notes chills. He denies any shortness of breath chest pains or palpitations nausea vomiting or diarrhea.   Past medical history is significant for options lymphoma diagnosed in 2012 currently in remission. His most recent oncology not at [facility] is summarized as: "Gregory Mccarthy is a 42yo male with a history of unfavorable limited stage HL treated with ABVD and radiation completed two years ago. His chance of relapse at this point is extremely low so there is no indication for routine CT scanning to assess for disease recurrence. He will see me or Dr. Adelina Mings every 6 months."  Past Medical History  Diagnosis Date  . Hodgkin lymphoma   . Cancer     Hodgkins   History  Substance Use Topics  . Smoking status: Never Smoker   . Smokeless tobacco: Not on file  . Alcohol Use: 3.0 oz/week    5 Glasses of wine per week   ROS as above Medications reviewed. No current facility-administered medications for this encounter.   Current Outpatient Prescriptions  Medication Sig Dispense Refill  . Multiple Vitamin (MULTIVITAMIN) tablet Take 1 tablet by mouth daily.      Marland Kitchen levofloxacin (LEVAQUIN) 500 MG tablet Take 1 tablet (500 mg total) by mouth daily.  10 tablet  0    Exam:  BP 133/75  Pulse 78  Temp(Src) 99.8 F (37.7 C) (Oral)  Resp 18  SpO2 96% Gen: Well NAD HEENT: EOMI,  MMM Lungs: Normal work of breathing. Crackles present in the right lower lung. Clear otherwise Heart: RRR no MRG Abd: NABS, NT, ND Exts: Non edematous BL  LE, warm and well perfused.   No results found for this or any previous visit (from the past 24 hour(s)). Dg Chest  2 View  11/12/2012   CLINICAL DATA:  Productive cough  EXAM: CHEST  2 VIEW  COMPARISON:  11/03/2012  FINDINGS: Cardiomediastinal silhouette is stable. There is right infrahilar infiltrate highly suspicious for pneumonia. This is best seen on lateral view. No pulmonary edema.  IMPRESSION: Right infrahilar infiltrate/ pneumonia. Follow-up to resolution is recommended.   Electronically Signed   By: Natasha Mead M.D.   On: 11/12/2012 09:48    Assessment and Plan: 42 y.o. male with right pneumonia. Plan to treat with Levaquin. Followup with primary care provider for resolution. Return sooner if worsening.  Discussed warning signs or symptoms. Please see discharge instructions. Patient expresses understanding.      Rodolph Bong, MD 11/12/12 1031

## 2012-11-12 NOTE — ED Notes (Signed)
C/o starting off with sore throat and fever on Thursday night.  Patient states now the congestion in his chest.  Ibuprofen has been taken for fever.

## 2013-01-01 IMAGING — CT CT BIOPSY
1 of 5 series · 14 of 32 positions shown, 19 images · non-contrast
Comparison: none

CLINICAL DATA: Lymphoma

CT GUIDED DEEP ILIAC BONE ASPIRATION AND CORE BIOPSY:
TECHNIQUE: Patient was placed supine on the CT gantry and limited
axial scans through the pelvis were obtained. Appropriate skin
entry site was identified. Skin site was marked, prepped with
Betadine,   draped in usual sterile fashion, and infiltrated
locally with 1% lidocaine.

[Series 2: rtn pelvis st · axial · 0.77mm/px · z∈[-432,-332]mm · 14 of 24 slices shown, 19 images]
[im 2/24  soft-tissue]
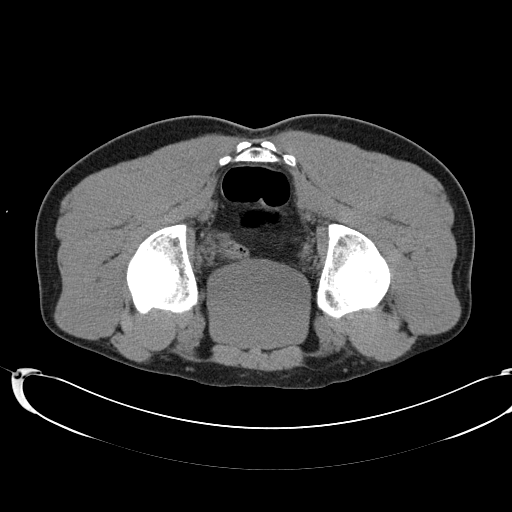
[im 2/24  bone]
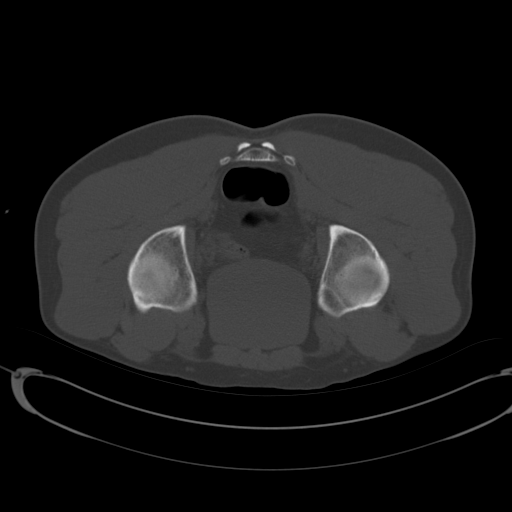
[im 4/24  soft-tissue]
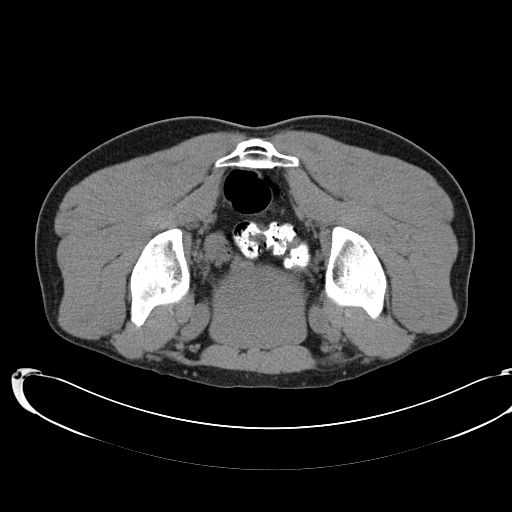
[im 5/24  soft-tissue]
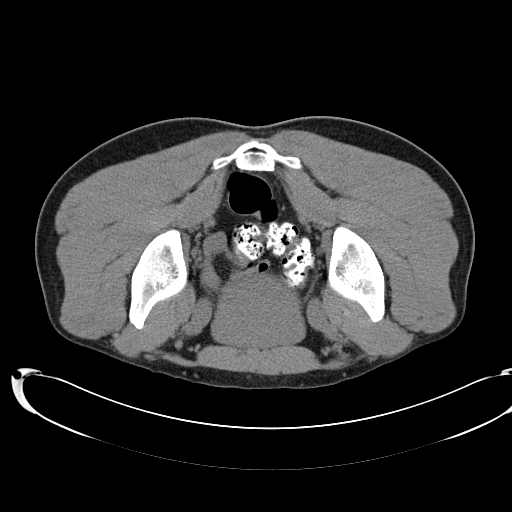
[im 7/24  soft-tissue]
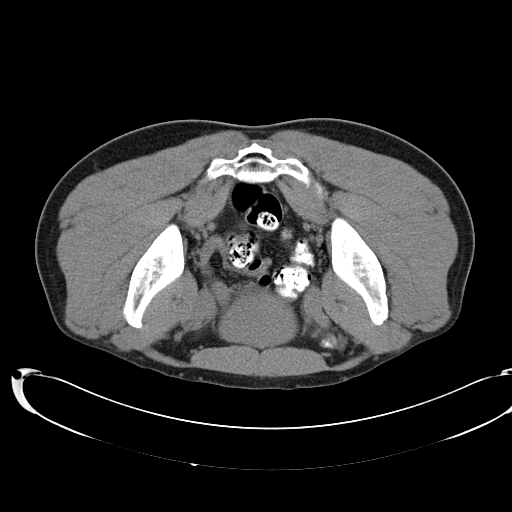
[im 9/24  soft-tissue]
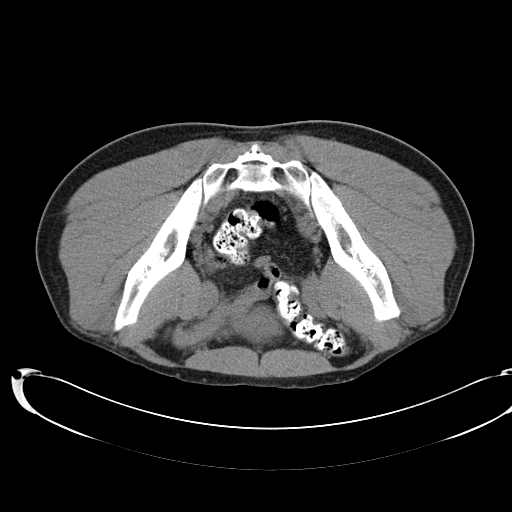
[im 10/24  soft-tissue]
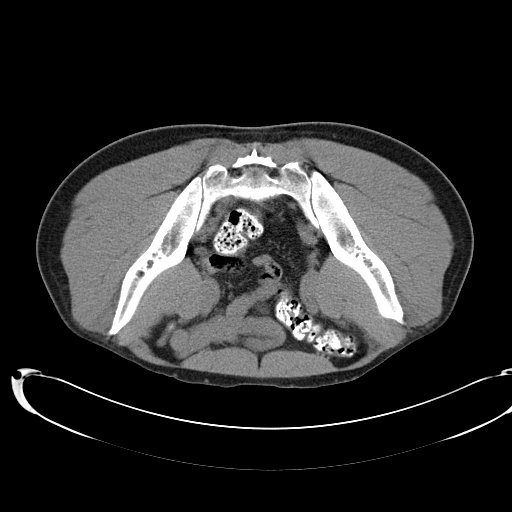
[im 13/24  soft-tissue]
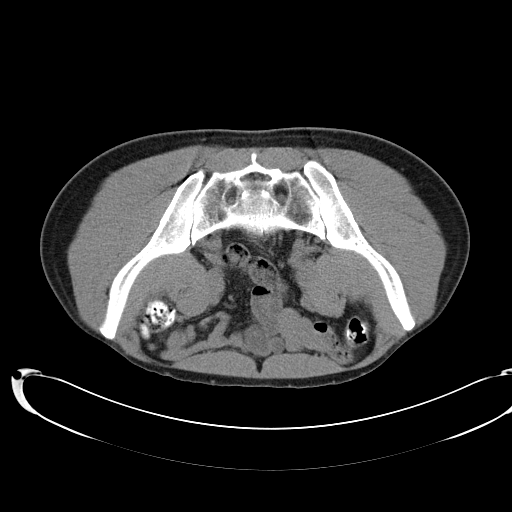
[im 14/24  soft-tissue]
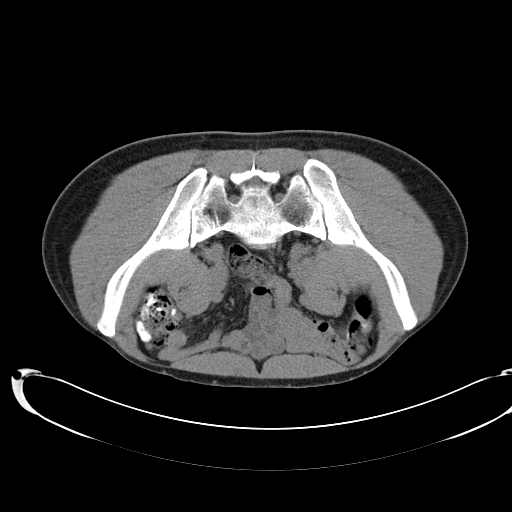
[im 15/24  soft-tissue]
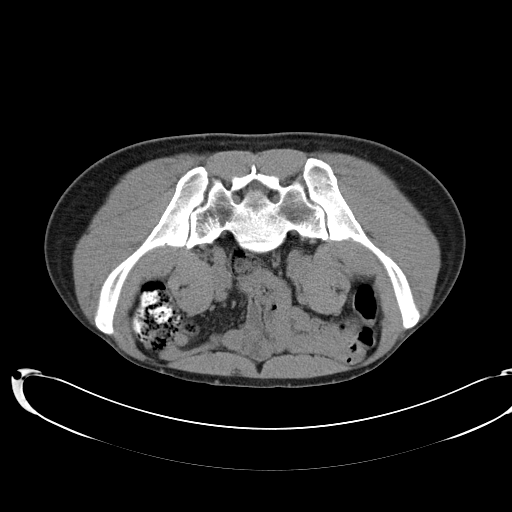
[im 15/24  bone]
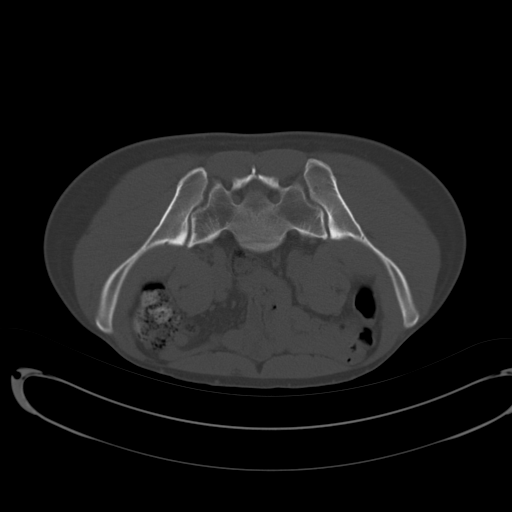
[im 17/24  soft-tissue]
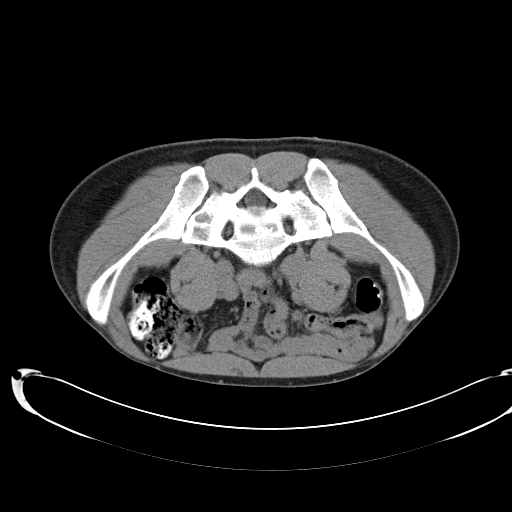
[im 19/24  soft-tissue]
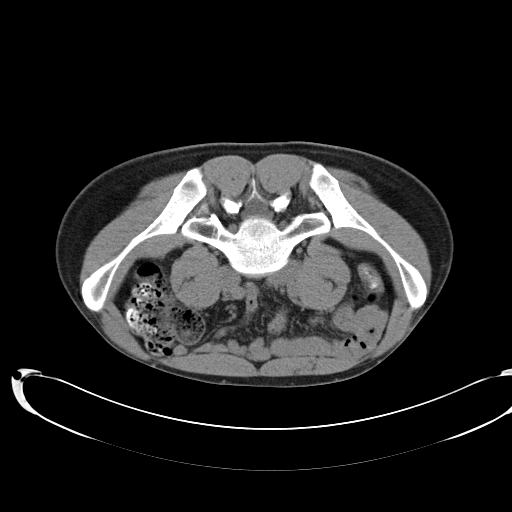
[im 19/24  lung]
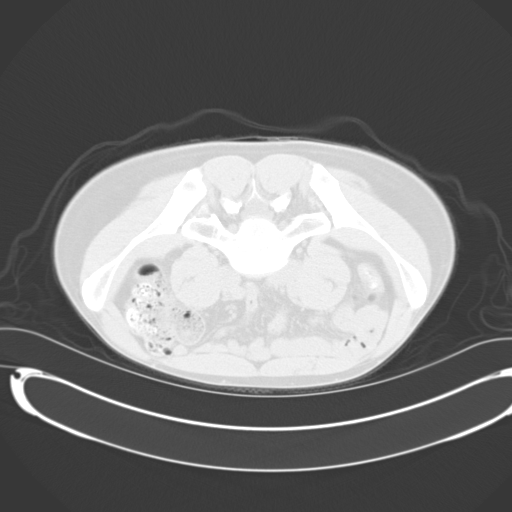
[im 20/24  soft-tissue]
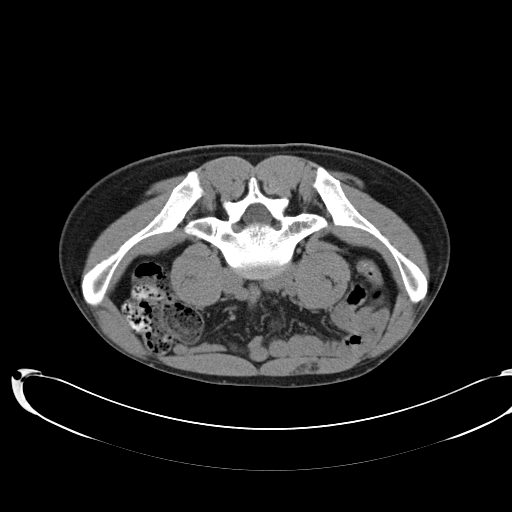
[im 20/24  lung]
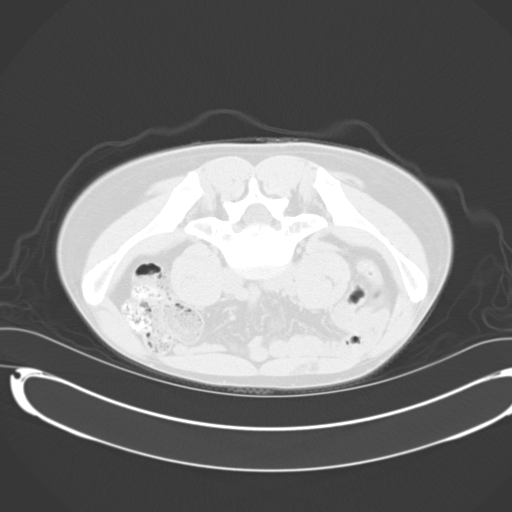
[im 21/24  lung]
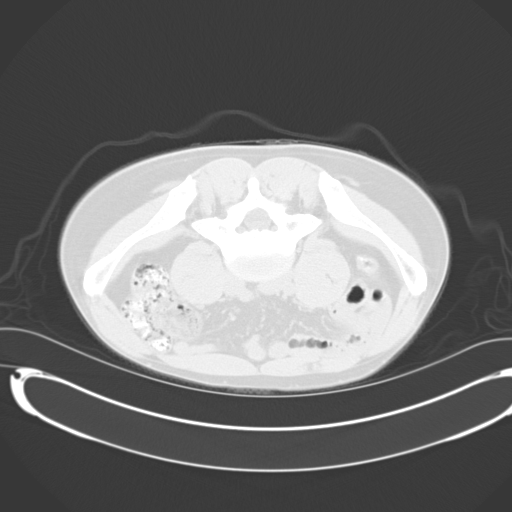
[im 22/24  soft-tissue]
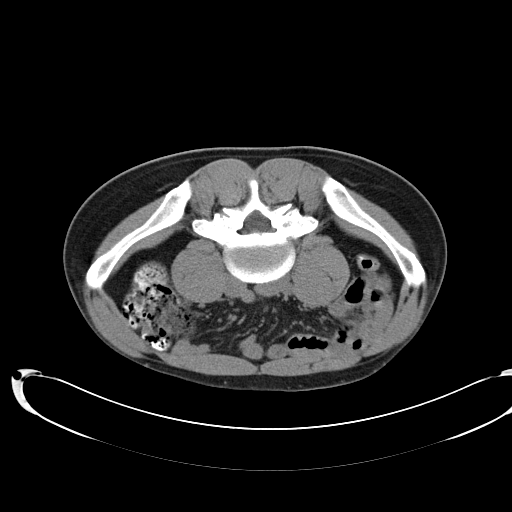
[im 22/24  lung]
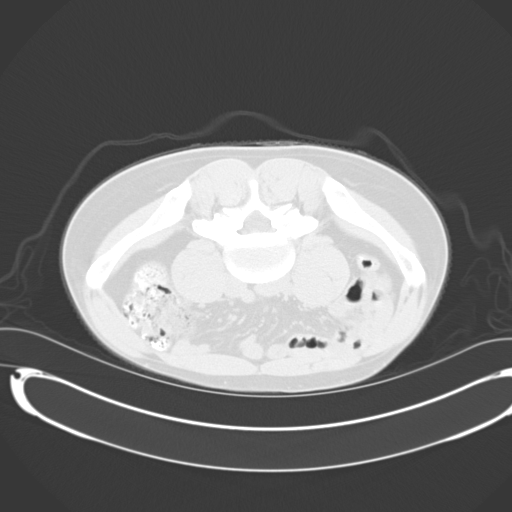

[14 of 32 positions shown; findings below may reference images not displayed]

Intravenous Fentanyl and Versed were administered as conscious
sedation during continuous cardiorespiratory monitoring by the
radiology RN, with a total moderate sedation time of 15 minutes.

 Under CT fluoroscopic guidance an 11-gauge Cook trocar bone needle
was advanced into the left iliac bone just lateral to the
sacroiliac joint. Once needle tip position was confirmed, coaxial
core and aspiration samples were obtained. The final sample was
obtained using the guiding needle itself, which was then removed.
Postprocedure scans show no hematoma or fracture. Patient tolerated
procedure well, with no immediate complication.
IMPRESSION: 1. Technically successful CT guided left iliac bone core and
aspiration biopsy.

## 2013-07-03 ENCOUNTER — Other Ambulatory Visit: Payer: Self-pay | Admitting: Dermatology

## 2013-08-21 ENCOUNTER — Other Ambulatory Visit: Payer: Self-pay | Admitting: Dermatology

## 2014-11-25 ENCOUNTER — Encounter: Payer: Self-pay | Admitting: Family Medicine

## 2014-11-25 ENCOUNTER — Ambulatory Visit (INDEPENDENT_AMBULATORY_CARE_PROVIDER_SITE_OTHER): Payer: 59 | Admitting: Family Medicine

## 2014-11-25 VITALS — BP 122/76 | HR 59 | Ht 72.0 in | Wt 162.0 lb

## 2014-11-25 DIAGNOSIS — S8991XA Unspecified injury of right lower leg, initial encounter: Secondary | ICD-10-CM

## 2014-11-25 NOTE — Patient Instructions (Signed)
You have either a knee contusion or at worst a lateral meniscus tear. Both of these are treated conservatively initially. Ice knee 15 minutes at a time 3-4 times a day. Aleve 2 tabs twice a day with food OR ibuprofen 600mg  three times a day with food - take regularly for 7 days then as needed after this. Work on motion exercises as we discussed. ACE wrap, knee brace, or sleeve for support when up and walking around until I see you back. Elevate above your heart as much as possible for swelling. Follow up with me in 2 weeks. If not improving would consider x-rays, MRI.

## 2014-11-26 DIAGNOSIS — S8991XA Unspecified injury of right lower leg, initial encounter: Secondary | ICD-10-CM | POA: Insufficient documentation

## 2014-11-26 NOTE — Assessment & Plan Note (Signed)
effusion confirmed by ultrasound.  He has been improving since the injury though not completely back to normal.  Likely due to contusion, possible lateral meniscus tear.  Advised we start with conservative treatment.  Icing, nsaids, home exercises, ace wrap for compression, elevation.  F/u in 2 weeks.  Consider imaging if not improving.

## 2014-11-26 NOTE — Progress Notes (Signed)
PCP: Lilian Coma., MD  Subjective:   HPI: Patient is a 44 y.o. male here for right knee injury.  Patient reports he was playing ice hockey on 11/5. He was tripped and landed directly onto his right knee. May have twisted as reaching for the puck when lying on ground as well. No swelling. Felt very unstable when tried to get up - has improved some since then. Able to walk better now. Pain level is 0/10 anterior knee. Pain is lateral however, can be sharp. Difficulty fully extending or flexing. Using ACE wrap. No prior injuries. No skin changes, fever, other complaints.  Past Medical History  Diagnosis Date  . Hodgkin lymphoma ([place])   . Cancer [facility])     Hodgkins    Current Outpatient Prescriptions on File Prior to Visit  Medication Sig Dispense Refill  . Multiple Vitamin (MULTIVITAMIN) tablet Take 1 tablet by mouth daily.     No current facility-administered medications on file prior to visit.    Past Surgical History  Procedure Laterality Date  . Lymph node dissection      Allergies  Allergen Reactions  . Other     Prednisone - difficulty getting off (develops rash as going off)    Social History   Social History  . Marital Status: Married    Spouse Name: N/A  . Number of Children: N/A  . Years of Education: N/A   Occupational History  . Not on file.   Social History Main Topics  . Smoking status: Never Smoker   . Smokeless tobacco: Not on file  . Alcohol Use: 3.0 oz/week    5 Glasses of wine per week  . Drug Use: No  . Sexual Activity: Not on file   Other Topics Concern  . Not on file   Social History Narrative    No family history on file.  BP 122/76 mmHg  Pulse 59  Ht 6' (1.829 m)  Wt 162 lb (73.483 kg)  BMI 21.97 kg/m2  Review of Systems: See HPI above.    Objective:  Physical Exam:  Gen: NAD  Right knee: Mild effusion.  No other gross deformity, ecchymoses. TTP lateral joint line.  No other tenderness.   Able to fully  extend, flex to 120 degrees. Negative ant/post drawers. Negative valgus/varus testing. Negative lachmanns. Negative mcmurrays, apleys, patellar apprehension. NV intact distally.  Left knee: FROM without pain.    Assessment & Plan:  1. Right knee injury - effusion confirmed by ultrasound.  He has been improving since the injury though not completely back to normal.  Likely due to contusion, possible lateral meniscus tear.  Advised we start with conservative treatment.  Icing, nsaids, home exercises, ace wrap for compression, elevation.  F/u in 2 weeks.  Consider imaging if not improving.

## 2014-12-09 ENCOUNTER — Encounter: Payer: Self-pay | Admitting: Family Medicine

## 2014-12-09 ENCOUNTER — Ambulatory Visit (INDEPENDENT_AMBULATORY_CARE_PROVIDER_SITE_OTHER): Payer: 59 | Admitting: Family Medicine

## 2014-12-09 VITALS — BP 117/67 | HR 56 | Ht 72.0 in | Wt 162.0 lb

## 2014-12-09 DIAGNOSIS — S8991XD Unspecified injury of right lower leg, subsequent encounter: Secondary | ICD-10-CM

## 2014-12-10 NOTE — Progress Notes (Signed)
PCP: Lilian Coma., MD  Subjective:   HPI: Patient is a 44 y.o. male here for right knee injury.  11/7: Patient reports he was playing ice hockey on 11/5. He was tripped and landed directly onto his right knee. May have twisted as reaching for the puck when lying on ground as well. No swelling. Felt very unstable when tried to get up - has improved some since then. Able to walk better now. Pain level is 0/10 anterior knee. Pain is lateral however, can be sharp. Difficulty fully extending or flexing. Using ACE wrap. No prior injuries. No skin changes, fever, other complaints.  11/21: Patient reports he feels much better. Still trouble flexing knee completely. No pain now though. Had been icing for a couple weeks, taking ibuprofen. Worse with full squat. Pain level 0/10, dull with the full flexion anterior knee. No skin changes, fever, other complaints.  Past Medical History  Diagnosis Date  . Hodgkin lymphoma ([place])   . Cancer [facility])     Hodgkins    Current Outpatient Prescriptions on File Prior to Visit  Medication Sig Dispense Refill  . Multiple Vitamin (MULTIVITAMIN) tablet Take 1 tablet by mouth daily.     No current facility-administered medications on file prior to visit.    Past Surgical History  Procedure Laterality Date  . Lymph node dissection      Allergies  Allergen Reactions  . Other     Prednisone - difficulty getting off (develops rash as going off)    Social History   Social History  . Marital Status: Married    Spouse Name: N/A  . Number of Children: N/A  . Years of Education: N/A   Occupational History  . Not on file.   Social History Main Topics  . Smoking status: Never Smoker   . Smokeless tobacco: Not on file  . Alcohol Use: 3.0 oz/week    5 Glasses of wine per week  . Drug Use: No  . Sexual Activity: Not on file   Other Topics Concern  . Not on file   Social History Narrative    No family history on file.  BP 117/67  mmHg  Pulse 56  Ht 6' (1.829 m)  Wt 162 lb (73.483 kg)  BMI 21.97 kg/m2  Review of Systems: See HPI above.    Objective:  Physical Exam:  Gen: NAD, comfortable in exam room.  Right knee: No effusion.  No other gross deformity, ecchymoses. No TTP lateral joint line.  No other tenderness.   FROM now with some tightness on full flexion. Trace ant drawer compared to left but solid end point.  Negative post drawer. Negative valgus/varus testing. Negative lachmanns. Negative mcmurrays, apleys, patellar apprehension. NV intact distally.  Left knee: FROM without pain.    Assessment & Plan:  1. Right knee injury - consistent with contusion - much improved.  He does have a little laxity on ant drawer only compared to left knee but good end point.  He will continue with home exercise program and only use ice, nsaids if needed.  Discussed return to hockey, sports as tolerated - may consider skating only this weekend to test it out before playing next weekend.  If he has any problems wait a full 4 more weeks, follow up with me at that time (can test at that time as well).

## 2014-12-10 NOTE — Assessment & Plan Note (Signed)
consistent with contusion - much improved.  He does have a little laxity on ant drawer only compared to left knee but good end point.  He will continue with home exercise program and only use ice, nsaids if needed.  Discussed return to hockey, sports as tolerated - may consider skating only this weekend to test it out before playing next weekend.  If he has any problems wait a full 4 more weeks, follow up with me at that time (can test at that time as well).

## 2022-01-16 ENCOUNTER — Emergency Department (HOSPITAL_COMMUNITY)
Admission: EM | Admit: 2022-01-16 | Discharge: 2022-01-17 | Discharge status: Against Medical Advice | Payer: BC Managed Care – PPO | Attending: Emergency Medicine | Admitting: Emergency Medicine

## 2022-01-16 ENCOUNTER — Emergency Department (HOSPITAL_COMMUNITY): Payer: BC Managed Care – PPO

## 2022-01-16 DIAGNOSIS — Z5321 Procedure and treatment not carried out due to patient leaving prior to being seen by health care provider: Secondary | ICD-10-CM | POA: Insufficient documentation

## 2022-01-16 DIAGNOSIS — S61214A Laceration without foreign body of right ring finger without damage to nail, initial encounter: Secondary | ICD-10-CM | POA: Insufficient documentation

## 2022-01-16 DIAGNOSIS — Y9322 Activity, ice hockey: Secondary | ICD-10-CM | POA: Diagnosis not present

## 2022-01-16 DIAGNOSIS — X58XXXA Exposure to other specified factors, initial encounter: Secondary | ICD-10-CM | POA: Diagnosis not present

## 2022-01-16 NOTE — ED Triage Notes (Signed)
Pt to ED c/o right ring finger laceration that occurred while playing ice hockey aprox 9 hours ago. Pt able to move finger, bleeding controlled in triage with bandage

## 2022-01-17 NOTE — ED Notes (Signed)
PT stated he had waited for over 4 hours and no longer wanted to wait due to finger stopped bleeding and left building
